# Patient Record
Sex: Female | Born: 1945 | Race: White | Hispanic: No | Smoking: Never smoker
Health system: Southern US, Community
[De-identification: ages and names within clinical notes are randomized; demographics above are authoritative.]

## PROBLEM LIST (undated history)

## (undated) DIAGNOSIS — I10 Essential (primary) hypertension: Secondary | ICD-10-CM

## (undated) DIAGNOSIS — M810 Age-related osteoporosis without current pathological fracture: Secondary | ICD-10-CM

## (undated) DIAGNOSIS — M7020 Olecranon bursitis, unspecified elbow: Secondary | ICD-10-CM

## (undated) DIAGNOSIS — E785 Hyperlipidemia, unspecified: Secondary | ICD-10-CM

## (undated) HISTORY — PX: ABDOMINAL HYSTERECTOMY: SHX81

## (undated) HISTORY — DX: Hyperlipidemia, unspecified: E78.5

## (undated) HISTORY — DX: Olecranon bursitis, unspecified elbow: M70.20

## (undated) HISTORY — DX: Age-related osteoporosis without current pathological fracture: M81.0

---

## 2006-10-22 ENCOUNTER — Emergency Department: Payer: Self-pay | Admitting: Emergency Medicine

## 2006-10-22 ENCOUNTER — Other Ambulatory Visit: Payer: Self-pay

## 2006-10-26 ENCOUNTER — Ambulatory Visit: Payer: Self-pay | Admitting: Emergency Medicine

## 2007-04-17 ENCOUNTER — Emergency Department: Payer: Self-pay | Admitting: Emergency Medicine

## 2007-04-20 ENCOUNTER — Other Ambulatory Visit: Payer: Self-pay

## 2007-04-20 ENCOUNTER — Ambulatory Visit: Payer: Self-pay | Admitting: Unknown Physician Specialty

## 2007-10-08 ENCOUNTER — Other Ambulatory Visit: Payer: Self-pay

## 2007-10-08 ENCOUNTER — Emergency Department: Payer: Self-pay | Admitting: Emergency Medicine

## 2009-01-16 ENCOUNTER — Ambulatory Visit: Payer: Self-pay

## 2009-03-11 ENCOUNTER — Ambulatory Visit: Payer: Self-pay | Admitting: Family Medicine

## 2010-02-28 DIAGNOSIS — L570 Actinic keratosis: Secondary | ICD-10-CM

## 2010-02-28 HISTORY — DX: Actinic keratosis: L57.0

## 2010-07-24 ENCOUNTER — Ambulatory Visit: Payer: Self-pay

## 2012-11-15 ENCOUNTER — Ambulatory Visit: Payer: Self-pay | Admitting: Family Medicine

## 2014-05-16 ENCOUNTER — Ambulatory Visit: Payer: Self-pay | Admitting: Neurology

## 2014-05-17 ENCOUNTER — Ambulatory Visit: Payer: Self-pay | Admitting: Family Medicine

## 2014-09-16 ENCOUNTER — Other Ambulatory Visit: Payer: Self-pay | Admitting: Family Medicine

## 2014-11-26 ENCOUNTER — Other Ambulatory Visit: Payer: Self-pay | Admitting: Family Medicine

## 2015-02-04 ENCOUNTER — Emergency Department: Payer: Worker's Compensation

## 2015-02-04 ENCOUNTER — Encounter: Payer: Self-pay | Admitting: Emergency Medicine

## 2015-02-04 ENCOUNTER — Inpatient Hospital Stay
Admission: EM | Admit: 2015-02-04 | Discharge: 2015-02-08 | DRG: 482 | Disposition: A | Discharge status: Skilled Nursing Facility | Payer: Worker's Compensation | Attending: Internal Medicine | Admitting: Internal Medicine

## 2015-02-04 DIAGNOSIS — W010XXA Fall on same level from slipping, tripping and stumbling without subsequent striking against object, initial encounter: Secondary | ICD-10-CM | POA: Diagnosis present

## 2015-02-04 DIAGNOSIS — I517 Cardiomegaly: Secondary | ICD-10-CM | POA: Diagnosis present

## 2015-02-04 DIAGNOSIS — M810 Age-related osteoporosis without current pathological fracture: Secondary | ICD-10-CM | POA: Diagnosis present

## 2015-02-04 DIAGNOSIS — E785 Hyperlipidemia, unspecified: Secondary | ICD-10-CM | POA: Diagnosis present

## 2015-02-04 DIAGNOSIS — I10 Essential (primary) hypertension: Secondary | ICD-10-CM | POA: Diagnosis present

## 2015-02-04 DIAGNOSIS — Z79899 Other long term (current) drug therapy: Secondary | ICD-10-CM | POA: Diagnosis not present

## 2015-02-04 DIAGNOSIS — Z419 Encounter for procedure for purposes other than remedying health state, unspecified: Secondary | ICD-10-CM

## 2015-02-04 DIAGNOSIS — S72141A Displaced intertrochanteric fracture of right femur, initial encounter for closed fracture: Principal | ICD-10-CM | POA: Diagnosis present

## 2015-02-04 HISTORY — DX: Essential (primary) hypertension: I10

## 2015-02-04 LAB — CBC WITH DIFFERENTIAL/PLATELET
Basophils Absolute: 0 10*3/uL (ref 0–0.1)
Basophils Relative: 0 %
Eosinophils Absolute: 0.1 10*3/uL (ref 0–0.7)
Eosinophils Relative: 1 %
HCT: 42.1 % (ref 35.0–47.0)
HEMOGLOBIN: 14 g/dL (ref 12.0–16.0)
LYMPHS ABS: 4.6 10*3/uL — AB (ref 1.0–3.6)
LYMPHS PCT: 41 %
MCH: 30.5 pg (ref 26.0–34.0)
MCHC: 33.1 g/dL (ref 32.0–36.0)
MCV: 91.9 fL (ref 80.0–100.0)
Monocytes Absolute: 0.3 10*3/uL (ref 0.2–0.9)
Monocytes Relative: 3 %
NEUTROS PCT: 55 %
Neutro Abs: 6.4 10*3/uL (ref 1.4–6.5)
Platelets: 160 10*3/uL (ref 150–440)
RBC: 4.58 MIL/uL (ref 3.80–5.20)
RDW: 13.4 % (ref 11.5–14.5)
WBC: 11.4 10*3/uL — AB (ref 3.6–11.0)

## 2015-02-04 LAB — BASIC METABOLIC PANEL
Anion gap: 8 (ref 5–15)
BUN: 23 mg/dL — AB (ref 6–20)
CHLORIDE: 108 mmol/L (ref 101–111)
CO2: 26 mmol/L (ref 22–32)
Calcium: 9.9 mg/dL (ref 8.9–10.3)
Creatinine, Ser: 0.74 mg/dL (ref 0.44–1.00)
GFR calc Af Amer: >60 mL/min (ref >60)
GFR calc non Af Amer: >60 mL/min (ref >60)
GLUCOSE: 99 mg/dL (ref 65–99)
POTASSIUM: 3.9 mmol/L (ref 3.5–5.1)
Sodium: 142 mmol/L (ref 135–145)

## 2015-02-04 LAB — APTT: aPTT: <24 seconds — ABNORMAL LOW (ref 24–36)

## 2015-02-04 LAB — PROTIME-INR
INR: 1.02
PROTHROMBIN TIME: 13.6 s (ref 11.4–15.0)

## 2015-02-04 LAB — SURGICAL PCR SCREEN
MRSA, PCR: NEGATIVE
Staphylococcus aureus: NEGATIVE

## 2015-02-04 MED ORDER — ONDANSETRON HCL 4 MG/2ML IJ SOLN
4.0000 mg | Freq: Once | INTRAMUSCULAR | Status: AC
  Administered 2015-02-04: 4 mg via INTRAVENOUS
  Filled 2015-02-04: qty 2

## 2015-02-04 MED ORDER — RALOXIFENE HCL 60 MG PO TABS
60.0000 mg | ORAL_TABLET | Freq: Every day | ORAL | Status: DC
  Administered 2015-02-06 – 2015-02-08 (×3): 60 mg via ORAL
  Filled 2015-02-04 (×3): qty 1

## 2015-02-04 MED ORDER — ACETAMINOPHEN 325 MG PO TABS: 650.0000 mg | ORAL_TABLET | Freq: Four times a day (QID) | ORAL | Status: DC | PRN

## 2015-02-04 MED ORDER — TURMERIC CURCUMIN 500 MG PO CAPS: 500.0000 mg | ORAL_CAPSULE | Freq: Every day | ORAL | Status: DC

## 2015-02-04 MED ORDER — DOCUSATE SODIUM 100 MG PO CAPS
100.0000 mg | ORAL_CAPSULE | Freq: Two times a day (BID) | ORAL | Status: DC
  Filled 2015-02-04: qty 1

## 2015-02-04 MED ORDER — METOPROLOL TARTRATE 50 MG PO TABS
50.0000 mg | ORAL_TABLET | Freq: Every morning | ORAL | Status: DC
  Administered 2015-02-05 – 2015-02-08 (×4): 50 mg via ORAL
  Filled 2015-02-04 (×4): qty 1

## 2015-02-04 MED ORDER — CALCIUM CARBONATE ANTACID 500 MG PO CHEW
1.0000 | CHEWABLE_TABLET | Freq: Every day | ORAL | Status: DC
  Administered 2015-02-06 – 2015-02-08 (×3): 200 mg via ORAL
  Filled 2015-02-04 (×4): qty 1

## 2015-02-04 MED ORDER — ONDANSETRON HCL 4 MG/2ML IJ SOLN
4.0000 mg | Freq: Four times a day (QID) | INTRAMUSCULAR | Status: DC | PRN
  Administered 2015-02-05: 4 mg via INTRAVENOUS

## 2015-02-04 MED ORDER — PRAVASTATIN SODIUM 20 MG PO TABS
20.0000 mg | ORAL_TABLET | Freq: Every day | ORAL | Status: DC
  Administered 2015-02-06 – 2015-02-08 (×3): 20 mg via ORAL
  Filled 2015-02-04 (×3): qty 1

## 2015-02-04 MED ORDER — CEFAZOLIN SODIUM-DEXTROSE 2-3 GM-% IV SOLR
2.0000 g | INTRAVENOUS | Status: AC
  Administered 2015-02-05: 2 g via INTRAVENOUS
  Filled 2015-02-04: qty 50

## 2015-02-04 MED ORDER — VITAMIN C 500 MG PO TABS
500.0000 mg | ORAL_TABLET | Freq: Every day | ORAL | Status: DC
  Administered 2015-02-06 – 2015-02-08 (×3): 500 mg via ORAL
  Filled 2015-02-04 (×3): qty 1

## 2015-02-04 MED ORDER — VITAMIN D 1000 UNITS PO TABS
1000.0000 [IU] | ORAL_TABLET | Freq: Every day | ORAL | Status: DC
  Administered 2015-02-06 – 2015-02-08 (×3): 1000 [IU] via ORAL
  Filled 2015-02-04 (×3): qty 1

## 2015-02-04 MED ORDER — SODIUM CHLORIDE 0.9 % IV BOLUS (SEPSIS)
500.0000 mL | Freq: Once | INTRAVENOUS | Status: AC
  Administered 2015-02-04: 500 mL via INTRAVENOUS

## 2015-02-04 MED ORDER — MORPHINE SULFATE (PF) 4 MG/ML IV SOLN: 4.0000 mg | Freq: Once | INTRAVENOUS | Status: DC

## 2015-02-04 MED ORDER — CALCIUM-VITAMIN D-VITAMIN K 500-1000-40 MG-UNT-MCG PO CHEW: CHEWABLE_TABLET | Freq: Every day | ORAL | Status: DC

## 2015-02-04 MED ORDER — ADULT MULTIVITAMIN W/MINERALS CH
1.0000 | ORAL_TABLET | Freq: Every day | ORAL | Status: DC
  Administered 2015-02-06 – 2015-02-08 (×3): 1 via ORAL
  Filled 2015-02-04 (×3): qty 1

## 2015-02-04 MED ORDER — ACETAMINOPHEN 650 MG RE SUPP: 650.0000 mg | Freq: Four times a day (QID) | RECTAL | Status: DC | PRN

## 2015-02-04 MED ORDER — ONDANSETRON HCL 4 MG PO TABS: 4.0000 mg | ORAL_TABLET | Freq: Four times a day (QID) | ORAL | Status: DC | PRN

## 2015-02-04 MED ORDER — ONDANSETRON HCL 4 MG/2ML IJ SOLN: 4.0000 mg | Freq: Once | INTRAMUSCULAR | Status: DC

## 2015-02-04 MED ORDER — HYDROCODONE-ACETAMINOPHEN 5-325 MG PO TABS: 1.0000 | ORAL_TABLET | ORAL | Status: DC | PRN

## 2015-02-04 MED ORDER — SENNOSIDES-DOCUSATE SODIUM 8.6-50 MG PO TABS: 1.0000 | ORAL_TABLET | Freq: Every evening | ORAL | Status: DC | PRN

## 2015-02-04 MED ORDER — RED YEAST RICE 600 MG PO CAPS: 600.0000 mg | ORAL_CAPSULE | Freq: Every day | ORAL | Status: DC

## 2015-02-04 MED ORDER — MORPHINE SULFATE (PF) 4 MG/ML IV SOLN
4.0000 mg | Freq: Once | INTRAVENOUS | Status: AC
  Administered 2015-02-04: 4 mg via INTRAVENOUS
  Filled 2015-02-04: qty 1

## 2015-02-04 MED ORDER — MORPHINE SULFATE (PF) 2 MG/ML IV SOLN
2.0000 mg | INTRAVENOUS | Status: DC | PRN
  Administered 2015-02-04 – 2015-02-05 (×3): 2 mg via INTRAVENOUS
  Filled 2015-02-04 (×3): qty 1

## 2015-02-04 NOTE — ED Notes (Signed)
Right hip swollen, painful. + pulses. No other injury noted. Pt with no other complaints.

## 2015-02-04 NOTE — Consult Note (Signed)
ORTHOPAEDIC CONSULTATION  REQUESTING PHYSICIAN: Nicholes Mango, MD  Chief Complaint:   Right hip pain.  History of Present Illness: Stacey [name] is a 69 y.o. female with a history of hypertension and osteoporosis, but otherwise in good health. Apparently, she was at her place of work (She works part-time at [employer] in [place].) when she turned to go back to [employer] while helping a shopper and lost her balance, falling onto her right side. She was unable to get up and so was brought by the paramedics to the hospital where x-rays in the emergency room demonstrated a displaced intertrochanteric fracture of her right hip. She is admitted at this time for definitive management of her injury. The patient denies any loss of consciousness or other injury associated with her fall. She also denies any lightheadedness, dizziness, chest pain, or other symptoms that may have precipitated her fall. She denies any numbness or paresthesias to her right lower extremity.  Past Medical History  Diagnosis Date  . Hypertension    Past Surgical History  Procedure Laterality Date  . Abdominal hysterectomy     Social History   Social History  . Marital Status: Married    Spouse Name: N/A  . Number of Children: N/A  . Years of Education: N/A   Social History Main Topics  . Smoking status: Never Smoker   . Smokeless tobacco: None  . Alcohol Use: No  . Drug Use: None  . Sexual Activity: Not Asked   Other Topics Concern  . None   Social History Narrative  . None   History reviewed. No pertinent family history. Allergies no known allergies Prior to Admission medications   Medication Sig Start Date End Date Taking? Authorizing Provider  Calcium Citrate-Vitamin D (CALCIUM + D PO) Take 1 tablet by mouth daily.   Yes Historical Provider, MD  Cholecalciferol (VITAMIN D-3) 1000 UNITS CAPS Take 1 capsule by mouth daily.   Yes  Historical Provider, MD  GARLIC PO Take 9,381 mg by mouth daily.    Yes Historical Provider, MD  metoprolol (LOPRESSOR) 50 MG tablet Take 50 mg by mouth every morning.   Yes Historical Provider, MD  Multiple Vitamin (MULTI-VITAMINS) TABS Take 1 tablet by mouth daily.   Yes Historical Provider, MD  pravastatin (PRAVACHOL) 20 MG tablet TAKE 1 TABLET BY MOUTH DAILY 11/27/14  Yes Vickki Muff Chrismon, PA  raloxifene (EVISTA) 60 MG tablet TAKE 1 TABLET BY MOUTH DAILY 11/27/14  Yes Dennis E Chrismon, PA  Red Yeast Rice Extract (RED YEAST RICE PO) Take 600 mg by mouth daily.    Yes Historical Provider, MD  Turmeric Curcumin 500 MG CAPS Take 500 mg by mouth daily.   Yes Historical Provider, MD  vitamin C (ASCORBIC ACID) 500 MG tablet Take 1 tablet by mouth daily.   Yes Historical Provider, MD   Dg Chest Portable 1 View  02/04/2015  CLINICAL DATA:  69 year old female preoperative study. Hypertension. Initial encounter. EXAM: PORTABLE CHEST 1 VIEW COMPARISON:  None. FINDINGS: Portable AP supine view at 1502 hours. Cardiomegaly. Mildly tortuous thoracic aorta. Other mediastinal contours are within normal limits. Visualized tracheal air column is within normal limits. Allowing for portable technique, the lungs are clear. No pneumothorax or pleural effusion evident on this supine view. IMPRESSION: Cardiomegaly.  No acute cardiopulmonary abnormality. Electronically Signed   By: Genevie Ann M.D.   On: 02/04/2015 15:16   Dg Hip Unilat With Pelvis 2-3 Views Right  02/04/2015  CLINICAL DATA:  Pain following fall EXAM: DG HIP (WITH OR WITHOUT PELVIS) 2-3V RIGHT COMPARISON:  None. FINDINGS: Frontal pelvis as well as frontal and lateral right hip images were obtained. There is a comminuted intertrochanteric femur fracture on the right with varus angulation at the fracture site. There is no other demonstrable fracture. No dislocation. There is mild narrowing of both hip joints. Bones are diffusely osteoporotic. There are foci of  arterial vascular calcification bilaterally. IMPRESSION: Comminuted intertrochanteric femur fracture on the right with varus angulation at the fracture site. No other fractures. No dislocation. Bones are diffusely osteoporotic. Electronically Signed   By: Lowella Grip III M.D.   On: 02/04/2015 14:58    Positive ROS: All other systems have been reviewed and were otherwise negative with the exception of those mentioned in the HPI and as above.  Physical Exam: General:  Alert, no acute distress Psychiatric:  Patient is competent for consent with normal mood and affect   Cardiovascular:  No pedal edema Respiratory:  No wheezing, non-labored breathing GI:  Abdomen is soft and non-tender Skin:  No lesions in the area of chief complaint Neurologic:  Sensation intact distally Lymphatic:  No axillary or cervical lymphadenopathy  Orthopedic Exam:  Orthopedic examination is limited to the right hip and lower extremity. The right lower extending somewhat shortened and extend rotated as compared to the left. She has pain with any attempted active or passive motion of the right hip and lower extremity. Skin inspection around her hip is unremarkable. She has mild tenderness to palpation over the lateral aspect of the hip. She is neurovascularly intact to the right lower extremity and she can actively dorsiflex and plantarflex her ankle and foot. She has intact sensation to light touch to all distributions. She has good capillary refill to all digits of her right foot.  X-rays:  X-rays of the pelvis and right hip are available for review. These films demonstrate a displaced three-part intertrochanteric fracture of the right hip. No significant degenerative changes are noted. No other acute abnormalities are identified. She does have moderate osteopenia diffusely.  Assessment: Displaced three-part intertrochanteric fracture right hip.  Plan: The treatment options are discussed with the patient and her  family at the bedside. The recommended treatment is a trochanteric femoral nailing of the right femur. The procedure has been discussed in detail with the patient has had the potential risks (including bleeding, infection, nerve and/or blood vessel injury, persistent or recurrent pain, hip and/or knee stiffness, malunion and/or nonunion, leg length inequality, need for further surgery, blood clots, strokes, heart attacks and/or arrhythmias, etc.) and benefits. The patient states her understanding and wishes to proceed. A formal written consent will be obtained by the nursing staff.  Thank you for ask me to participate in the care of this most delightful woman. I will be happy to follow her with you.   Pascal Lux, MD  Beeper #:  [phone]  02/04/2015 6:05 PM

## 2015-02-04 NOTE — ED Provider Notes (Signed)
[facility] Emergency Department Provider Note  ____________________________________________  Time seen: Approximately 1:34 PM  I have reviewed the triage vital signs and the nursing notes.   HISTORY  Chief Complaint Hip Pain    HPI Stacey [name] is a 69 y.o. female with history of hypertension and hyperlipidemia who presents for evaluation of traumatic right hip pain which began suddenly just prior to arrival, has been constant since onset and is worse with movement. The patient reports that she was at work. She works in [employer], turned around suddenly to speak to a customer, tripped over her foot and fell, landing onto her right hip. No head injury or loss of consciousness. Currently her pain is moderate to severe.   Past Medical History  Diagnosis Date  . Hypertension     Patient Active Problem List   Diagnosis Date Noted  . Intertrochanteric fracture of right femur ([place]) 02/04/2015    Past Surgical History  Procedure Laterality Date  . Abdominal hysterectomy      Current Outpatient Rx  Name  Route  Sig  Dispense  Refill  . Calcium Citrate-Vitamin D (CALCIUM + D PO)   Oral   Take 1 tablet by mouth daily.         . Cholecalciferol (VITAMIN D-3) 1000 UNITS CAPS   Oral   Take 1 capsule by mouth daily.         Marland Kitchen GARLIC PO   Oral   Take 1,000 mg by mouth daily.          . metoprolol (LOPRESSOR) 50 MG tablet   Oral   Take 50 mg by mouth every morning.         . Multiple Vitamin (MULTI-VITAMINS) TABS   Oral   Take 1 tablet by mouth daily.         . pravastatin (PRAVACHOL) 20 MG tablet      TAKE 1 TABLET BY MOUTH DAILY   30 tablet   1     CYCLE FILL MEDICATION. Authorization is required f ...   . raloxifene (EVISTA) 60 MG tablet      TAKE 1 TABLET BY MOUTH DAILY   30 tablet   1     CYCLE FILL MEDICATION. Authorization is required f ...   . Red Yeast Rice Extract (RED YEAST RICE PO)   Oral   Take 600 mg  by mouth daily.          . Turmeric Curcumin 500 MG CAPS   Oral   Take 500 mg by mouth daily.         . vitamin C (ASCORBIC ACID) 500 MG tablet   Oral   Take 1 tablet by mouth daily.           Allergies Review of patient's allergies indicates no known allergies.  History reviewed. No pertinent family history.  Social History Social History  Substance Use Topics  . Smoking status: Never Smoker   . Smokeless tobacco: None  . Alcohol Use: No    Review of Systems Constitutional: No fever/chills Eyes: No visual changes. ENT: No sore throat. Cardiovascular: Denies chest pain. Respiratory: Denies shortness of breath. Gastrointestinal: No abdominal pain.  No nausea, no vomiting.  No diarrhea.  No constipation. Genitourinary: Negative for dysuria. Musculoskeletal: Negative for back pain. Skin: Negative for rash. Neurological: Negative for headaches, focal weakness or numbness.  10-point ROS otherwise negative.  ____________________________________________   PHYSICAL EXAM:  Filed Vitals:   02/04/15  1336 02/04/15 1407 02/04/15 1502 02/04/15 1600  BP: 143/96 136/89  147/98  Pulse:  68 66 70  Temp:      Height:      Weight:      SpO2:  98% 96% 97%    Constitutional: Alert and oriented. Well appearing and in no acute distress. Eyes: Conjunctivae are normal. PERRL. EOMI. Head: Atraumatic. Nose: No congestion/rhinnorhea. Mouth/Throat: Mucous membranes are moist.  Oropharynx non-erythematous. Neck: No stridor.  No cervical spine tenderness to palpation. Cardiovascular: Normal rate, regular rhythm. Grossly normal heart sounds.  Good peripheral circulation. Respiratory: Normal respiratory effort.  No retractions. Lungs CTAB. Gastrointestinal: Soft and nontender. No distention.  No CVA tenderness. Genitourinary: deferred Musculoskeletal: Tenderness to palpation throughout the right groin. 2+ right DP pulse. The right leg is slightly shortened and externally rotated.  No tenderness throughout the left hemipelvis.  Neurologic:  Normal speech and language. No gross focal neurologic deficits are appreciated.  Skin:  Skin is warm, dry and intact. No rash noted. Psychiatric: Mood and affect are normal. Speech and behavior are normal.  ____________________________________________   LABS (all labs ordered are listed, but only abnormal results are displayed)  Labs Reviewed  CBC WITH DIFFERENTIAL/PLATELET - Abnormal; Notable for the following:    WBC 11.4 (*)    Lymphs Abs 4.6 (*)    All other components within normal limits  BASIC METABOLIC PANEL - Abnormal; Notable for the following:    BUN 23 (*)    All other components within normal limits  PROTIME-INR  APTT   ____________________________________________  EKG  ED ECG REPORT I, Joanne Gavel, the attending physician, personally viewed and interpreted this ECG.   Date: 02/04/2015  EKG Time: 15:59  Rate: 70  Rhythm: normal sinus rhythm  Axis: left  Intervals:none  ST&T Change: No acute ST elevation  ____________________________________________  RADIOLOGY  CXR IMPRESSION: Cardiomegaly. No acute cardiopulmonary abnormality.  Xray right hip IMPRESSION: Comminuted intertrochanteric femur fracture on the right with varus angulation at the fracture site. No other fractures. No dislocation. Bones are diffusely osteoporotic.  ____________________________________________   PROCEDURES  Procedure(s) performed: None  Critical Care performed: No  ____________________________________________   INITIAL IMPRESSION / ASSESSMENT AND PLAN / ED COURSE  Pertinent labs & imaging results that were available during my care of the patient were reviewed by me and considered in my medical decision making (see chart for details).  Stacey [name] is a 69 y.o. female with history of hypertension and hyperlipidemia who presents for evaluation of traumatic right hip pain which began suddenly just  prior to arrival. Plain films consistent with right intertrochanteric femur fracture and she is neurovascularly intact distally. I reviewed notable for mild leukocytosis, likely catecholamine induced. BMP unremarkable. Awaiting coags. Case discussed with Dr. Roland Rack of orthopedic surgery who is aware of the patient. Case discussed with Dr. Margaretmary Eddy, hospitalist, for admission at 4:10 PM. ____________________________________________   FINAL CLINICAL IMPRESSION(S) / ED DIAGNOSES  Final diagnoses:  Closed displaced intertrochanteric fracture of right femur, initial encounter ([place])      Joanne Gavel, MD 02/04/15 1611

## 2015-02-04 NOTE — Progress Notes (Signed)
PHARMACIST - PHYSICIAN ORDER COMMUNICATION  CONCERNING: P&T Medication Policy on Herbal Medications  DESCRIPTION:  This patient's order for:  Red rice yeast and tumeric  has been noted.  This product(s) is classified as an "herbal" or natural product. Due to a lack of definitive safety studies or FDA approval, nonstandard manufacturing practices, plus the potential risk of unknown drug-drug interactions while on inpatient medications, the Pharmacy and Therapeutics Committee does not permit the use of "herbal" or natural products of this type within [facility].   ACTION TAKEN: The pharmacy department is unable to verify this order at this time and your patient has been informed of this safety policy. Please reevaluate patient's clinical condition at discharge and address if the herbal or natural product(s) should be resumed at that time.

## 2015-02-04 NOTE — H&P (Signed)
[facility] [place] NAME: Stacey [name]    MR#:  [id number]  DATE OF BIRTH:  16-Jun-1945  DATE OF ADMISSION:  02/04/2015  PRIMARY CARE PHYSICIAN: No primary care provider on file.   REQUESTING/REFERRING PHYSICIAN: Edd Fabian ,MD  CHIEF COMPLAINT:   fall HISTORY OF PRESENT ILLNESS:  Stacey [name]  is a 69 y.o. female with a known history of hypertension, hyperlipidemia and osteoporosis is brought into the ED after she sustained a fall. Patient tripped over and landed on her right side and came into the ED with right hip pain. Xray has revealed rt intertrochanteric fracture. ED physician has discussed with on-call orthopedic doctor, who is going to see the patient PAST MEDICAL HISTORY:   Past Medical History  Diagnosis Date  . Hypertension   Hyperlipidemia, osteoporosis    PAST SURGICAL HISTOIRY:   Past Surgical History  Procedure Laterality Date  . Abdominal hysterectomy      SOCIAL HISTORY:   Social History  Substance Use Topics  . Smoking status: Never Smoker   . Smokeless tobacco: Not on file  . Alcohol Use: No    FAMILY HISTORY:  HTN runs in her family  DRUG ALLERGIES:  Allergies no known allergies  REVIEW OF SYSTEMS:  CONSTITUTIONAL: No fever, fatigue or weakness.  EYES: No blurred or double vision.  EARS, NOSE, AND THROAT: No tinnitus or ear pain.  RESPIRATORY: No cough, shortness of breath, wheezing or hemoptysis.  CARDIOVASCULAR: No chest pain, orthopnea, edema.  GASTROINTESTINAL: No nausea, vomiting, diarrhea or abdominal pain.  GENITOURINARY: No dysuria, hematuria.  ENDOCRINE: No polyuria, nocturia,  HEMATOLOGY: No anemia, easy bruising or bleeding SKIN: No rash or lesion. MUSCULOSKELETAL: Rt hip pain. Has osteoporosis . No arthritis.   NEUROLOGIC: No tingling, numbness, weakness.  PSYCHIATRY: No anxiety or depression.   MEDICATIONS AT HOME:   Prior to Admission medications   Medication Sig  Start Date End Date Taking? Authorizing Provider  Calcium Citrate-Vitamin D (CALCIUM + D PO) Take 1 tablet by mouth daily.   Yes Historical Provider, MD  Cholecalciferol (VITAMIN D-3) 1000 UNITS CAPS Take 1 capsule by mouth daily.   Yes Historical Provider, MD  GARLIC PO Take 6,948 mg by mouth daily.    Yes Historical Provider, MD  metoprolol (LOPRESSOR) 50 MG tablet Take 50 mg by mouth every morning.   Yes Historical Provider, MD  Multiple Vitamin (MULTI-VITAMINS) TABS Take 1 tablet by mouth daily.   Yes Historical Provider, MD  pravastatin (PRAVACHOL) 20 MG tablet TAKE 1 TABLET BY MOUTH DAILY 11/27/14  Yes Vickki Muff Chrismon, PA  raloxifene (EVISTA) 60 MG tablet TAKE 1 TABLET BY MOUTH DAILY 11/27/14  Yes Dennis E Chrismon, PA  Red Yeast Rice Extract (RED YEAST RICE PO) Take 600 mg by mouth daily.    Yes Historical Provider, MD  Turmeric Curcumin 500 MG CAPS Take 500 mg by mouth daily.   Yes Historical Provider, MD  vitamin C (ASCORBIC ACID) 500 MG tablet Take 1 tablet by mouth daily.   Yes Historical Provider, MD      VITAL SIGNS:  Blood pressure 147/98, pulse 70, temperature 97.4 F (36.3 C), height 5\' 5"  (1.651 m), weight 63.05 kg (139 lb), SpO2 97 %.  PHYSICAL EXAMINATION:  GENERAL:  69 y.o.-year-old patient lying in the bed with no acute distress.  EYES: Pupils equal, round, reactive to light and accommodation. No scleral icterus. Extraocular muscles intact.  HEENT: Head atraumatic, normocephalic. Oropharynx and nasopharynx clear.  NECK:  Supple, no jugular venous distention. No thyroid enlargement, no tenderness.  LUNGS: Normal breath sounds bilaterally, no wheezing, rales,rhonchi or crepitation. No use of accessory muscles of respiration.  CARDIOVASCULAR: S1, S2 normal. No murmurs, rubs, or gallops.  ABDOMEN: Soft, nontender, nondistended. Bowel sounds present. No organomegaly or mass.  EXTREMITIES: Right hip is tender, abducted. No pedal edema, cyanosis, or clubbing.  NEUROLOGIC:  Cranial nerves II through XII are intact. Muscle strength 5/5 in all extremities. Sensation intact. Gait not checked.  PSYCHIATRIC: The patient is alert and oriented x 3.  SKIN: No obvious rash, lesion, or ulcer.   LABORATORY PANEL:   CBC  Recent Labs Lab 02/04/15 1402  WBC 11.4*  HGB 14.0  HCT 42.1  PLT 160   ------------------------------------------------------------------------------------------------------------------  Chemistries   Recent Labs Lab 02/04/15 1402  NA 142  K 3.9  CL 108  CO2 26  GLUCOSE 99  BUN 23*  CREATININE 0.74  CALCIUM 9.9   ------------------------------------------------------------------------------------------------------------------  Cardiac Enzymes No results for input(s): TROPONINI in the last 168 hours. ------------------------------------------------------------------------------------------------------------------  RADIOLOGY:  Dg Chest Portable 1 View  02/04/2015  CLINICAL DATA:  69 year old female preoperative study. Hypertension. Initial encounter. EXAM: PORTABLE CHEST 1 VIEW COMPARISON:  None. FINDINGS: Portable AP supine view at 1502 hours. Cardiomegaly. Mildly tortuous thoracic aorta. Other mediastinal contours are within normal limits. Visualized tracheal air column is within normal limits. Allowing for portable technique, the lungs are clear. No pneumothorax or pleural effusion evident on this supine view. IMPRESSION: Cardiomegaly.  No acute cardiopulmonary abnormality. Electronically Signed   By: Genevie Ann M.D.   On: 02/04/2015 15:16   Dg Hip Unilat With Pelvis 2-3 Views Right  02/04/2015  CLINICAL DATA:  Pain following fall EXAM: DG HIP (WITH OR WITHOUT PELVIS) 2-3V RIGHT COMPARISON:  None. FINDINGS: Frontal pelvis as well as frontal and lateral right hip images were obtained. There is a comminuted intertrochanteric femur fracture on the right with varus angulation at the fracture site. There is no other demonstrable fracture. No  dislocation. There is mild narrowing of both hip joints. Bones are diffusely osteoporotic. There are foci of arterial vascular calcification bilaterally. IMPRESSION: Comminuted intertrochanteric femur fracture on the right with varus angulation at the fracture site. No other fractures. No dislocation. Bones are diffusely osteoporotic. Electronically Signed   By: Lowella Grip III M.D.   On: 02/04/2015 14:58    EKG:   Orders placed or performed during the hospital encounter of 02/04/15  . EKG 12-Lead  . EKG 12-Lead    IMPRESSION AND PLAN:  Patient is brought into the ED after she sustained a fall by tripping over  1. Right femoral intertrochanteric fracture Consult ortrho -  Pain management PRN Medically cleared for surgery   2. Essential hypertension We'll continue metoprolol  3. Hyperlipidemia Continue home medication statin  4. Chronic history of osteoporosis Continue home med Evista  DVT prophylaxis with teds  All the records are reviewed and case discussed with ED provider. Management plans discussed with the patient, family and they are in agreement.  CODE STATUS: FC,daughter is the HCPOA  TOTAL TIME TAKING CARE OF THIS PATIENT: 45 minutes.    Nicholes Mango M.D on 02/04/2015 at 4:09 PM  Between 7am to 6pm - Pager - [phone]  After 6pm go to [email or web address] - password [email or web address] [facility]  [facility]  Office  [phone]  CC: Primary care physician; No primary care provider on file.

## 2015-02-04 NOTE — ED Notes (Signed)
Pt at work and tripped and fell. C/o right hip pain

## 2015-02-05 ENCOUNTER — Inpatient Hospital Stay: Payer: Worker's Compensation | Admitting: Certified Registered Nurse Anesthetist

## 2015-02-05 ENCOUNTER — Encounter
Admission: EM | Disposition: A | Discharge status: Skilled Nursing Facility | Payer: Self-pay | Source: Home / Self Care | Attending: Internal Medicine

## 2015-02-05 ENCOUNTER — Inpatient Hospital Stay: Payer: Worker's Compensation

## 2015-02-05 HISTORY — PX: INTRAMEDULLARY (IM) NAIL INTERTROCHANTERIC: SHX5875

## 2015-02-05 LAB — BASIC METABOLIC PANEL
Anion gap: 6 (ref 5–15)
BUN: 20 mg/dL (ref 6–20)
CHLORIDE: 105 mmol/L (ref 101–111)
CO2: 23 mmol/L (ref 22–32)
CREATININE: 0.79 mg/dL (ref 0.44–1.00)
Calcium: 8.5 mg/dL — ABNORMAL LOW (ref 8.9–10.3)
GFR calc Af Amer: >60 mL/min (ref >60)
GFR calc non Af Amer: >60 mL/min (ref >60)
GLUCOSE: 106 mg/dL — AB (ref 65–99)
Potassium: 3.8 mmol/L (ref 3.5–5.1)
SODIUM: 134 mmol/L — AB (ref 135–145)

## 2015-02-05 LAB — CBC
HEMATOCRIT: 36.5 % (ref 35.0–47.0)
HEMOGLOBIN: 12 g/dL (ref 12.0–16.0)
MCH: 30.1 pg (ref 26.0–34.0)
MCHC: 33 g/dL (ref 32.0–36.0)
MCV: 91.3 fL (ref 80.0–100.0)
Platelets: 153 10*3/uL (ref 150–440)
RBC: 4 MIL/uL (ref 3.80–5.20)
RDW: 12.9 % (ref 11.5–14.5)
WBC: 11.4 10*3/uL — ABNORMAL HIGH (ref 3.6–11.0)

## 2015-02-05 LAB — PROTIME-INR
INR: 1.15
Prothrombin Time: 14.9 seconds (ref 11.4–15.0)

## 2015-02-05 SURGERY — FIXATION, FRACTURE, INTERTROCHANTERIC, WITH INTRAMEDULLARY ROD
Anesthesia: Spinal | Site: Hip | Laterality: Right | Wound class: Clean

## 2015-02-05 MED ORDER — ONDANSETRON HCL 4 MG/2ML IJ SOLN: 4.0000 mg | Freq: Once | INTRAMUSCULAR | Status: DC | PRN

## 2015-02-05 MED ORDER — BISACODYL 10 MG RE SUPP
10.0000 mg | Freq: Every day | RECTAL | Status: DC | PRN
  Administered 2015-02-08: 10 mg via RECTAL
  Filled 2015-02-05: qty 1

## 2015-02-05 MED ORDER — FENTANYL CITRATE (PF) 100 MCG/2ML IJ SOLN
INTRAMUSCULAR | Status: DC | PRN
  Administered 2015-02-05: 50 ug via INTRAVENOUS

## 2015-02-05 MED ORDER — FENTANYL CITRATE (PF) 100 MCG/2ML IJ SOLN: 25.0000 ug | INTRAMUSCULAR | Status: DC | PRN

## 2015-02-05 MED ORDER — ACETAMINOPHEN 650 MG RE SUPP: 650.0000 mg | Freq: Four times a day (QID) | RECTAL | Status: DC | PRN

## 2015-02-05 MED ORDER — CEFAZOLIN SODIUM-DEXTROSE 2-3 GM-% IV SOLR
2.0000 g | Freq: Four times a day (QID) | INTRAVENOUS | Status: AC
  Administered 2015-02-05 – 2015-02-06 (×3): 2 g via INTRAVENOUS
  Filled 2015-02-05 (×3): qty 50

## 2015-02-05 MED ORDER — ONDANSETRON HCL 4 MG/2ML IJ SOLN: 4.0000 mg | Freq: Four times a day (QID) | INTRAMUSCULAR | Status: DC | PRN

## 2015-02-05 MED ORDER — DOCUSATE SODIUM 100 MG PO CAPS
100.0000 mg | ORAL_CAPSULE | Freq: Two times a day (BID) | ORAL | Status: DC
  Administered 2015-02-05 – 2015-02-08 (×6): 100 mg via ORAL
  Filled 2015-02-05 (×6): qty 1

## 2015-02-05 MED ORDER — MIDAZOLAM HCL 5 MG/5ML IJ SOLN
INTRAMUSCULAR | Status: DC | PRN
Start: 2015-02-05 — End: 2015-02-05
  Administered 2015-02-05: 2 mg via INTRAVENOUS

## 2015-02-05 MED ORDER — ONDANSETRON HCL 4 MG PO TABS: 4.0000 mg | ORAL_TABLET | Freq: Four times a day (QID) | ORAL | Status: DC | PRN

## 2015-02-05 MED ORDER — ENOXAPARIN SODIUM 40 MG/0.4ML ~~LOC~~ SOLN
40.0000 mg | SUBCUTANEOUS | Status: DC
  Administered 2015-02-06 – 2015-02-08 (×3): 40 mg via SUBCUTANEOUS
  Filled 2015-02-05 (×3): qty 0.4

## 2015-02-05 MED ORDER — DIPHENHYDRAMINE HCL 12.5 MG/5ML PO ELIX: 12.5000 mg | ORAL_SOLUTION | ORAL | Status: DC | PRN

## 2015-02-05 MED ORDER — METOCLOPRAMIDE HCL 5 MG PO TABS: 5.0000 mg | ORAL_TABLET | Freq: Three times a day (TID) | ORAL | Status: DC | PRN

## 2015-02-05 MED ORDER — HYDROMORPHONE HCL 1 MG/ML IJ SOLN
0.5000 mg | INTRAMUSCULAR | Status: DC | PRN
  Administered 2015-02-05 – 2015-02-06 (×2): 1 mg via INTRAVENOUS
  Filled 2015-02-05: qty 1

## 2015-02-05 MED ORDER — KCL IN DEXTROSE-NACL 20-5-0.9 MEQ/L-%-% IV SOLN
INTRAVENOUS | Status: DC
  Administered 2015-02-05 – 2015-02-06 (×2): via INTRAVENOUS
  Filled 2015-02-05 (×8): qty 1000

## 2015-02-05 MED ORDER — MAGNESIUM HYDROXIDE 400 MG/5ML PO SUSP
30.0000 mL | Freq: Every day | ORAL | Status: DC | PRN
  Administered 2015-02-08: 30 mL via ORAL
  Filled 2015-02-05: qty 30

## 2015-02-05 MED ORDER — PANTOPRAZOLE SODIUM 40 MG PO TBEC
40.0000 mg | DELAYED_RELEASE_TABLET | Freq: Two times a day (BID) | ORAL | Status: DC
  Administered 2015-02-05 – 2015-02-08 (×6): 40 mg via ORAL
  Filled 2015-02-05 (×6): qty 1

## 2015-02-05 MED ORDER — ACETAMINOPHEN 325 MG PO TABS: 650.0000 mg | ORAL_TABLET | Freq: Four times a day (QID) | ORAL | Status: DC | PRN

## 2015-02-05 MED ORDER — BUPIVACAINE HCL (PF) 0.5 % IJ SOLN
INTRAMUSCULAR | Status: DC | PRN
  Administered 2015-02-05: 3 mL

## 2015-02-05 MED ORDER — PROPOFOL 500 MG/50ML IV EMUL
INTRAVENOUS | Status: DC | PRN
  Administered 2015-02-05: 100 ug/kg/min via INTRAVENOUS

## 2015-02-05 MED ORDER — LACTATED RINGERS IV SOLN
INTRAVENOUS | Status: DC | PRN
  Administered 2015-02-05: 14:00:00 via INTRAVENOUS

## 2015-02-05 MED ORDER — PHENYLEPHRINE HCL 10 MG/ML IJ SOLN
INTRAMUSCULAR | Status: DC | PRN
  Administered 2015-02-05: 100 ug via INTRAVENOUS
  Administered 2015-02-05 (×2): 200 ug via INTRAVENOUS
  Administered 2015-02-05: 100 ug via INTRAVENOUS

## 2015-02-05 MED ORDER — FLEET ENEMA 7-19 GM/118ML RE ENEM: 1.0000 | ENEMA | Freq: Once | RECTAL | Status: DC | PRN

## 2015-02-05 MED ORDER — SODIUM CHLORIDE 0.9 % IV SOLN
INTRAVENOUS | Status: DC
  Administered 2015-02-05: 08:00:00 via INTRAVENOUS

## 2015-02-05 MED ORDER — METOCLOPRAMIDE HCL 5 MG/ML IJ SOLN: 5.0000 mg | Freq: Three times a day (TID) | INTRAMUSCULAR | Status: DC | PRN

## 2015-02-05 MED ORDER — NEOMYCIN-POLYMYXIN B GU 40-200000 IR SOLN
Status: DC | PRN
  Administered 2015-02-05: 2 mL

## 2015-02-05 MED ORDER — OXYCODONE HCL 5 MG PO TABS
5.0000 mg | ORAL_TABLET | ORAL | Status: DC | PRN
  Administered 2015-02-05: 5 mg via ORAL
  Administered 2015-02-05 – 2015-02-06 (×2): 10 mg via ORAL
  Administered 2015-02-06: 5 mg via ORAL
  Administered 2015-02-06: 10 mg via ORAL
  Administered 2015-02-07 (×2): 5 mg via ORAL
  Administered 2015-02-07 (×2): 10 mg via ORAL
  Administered 2015-02-08 (×2): 5 mg via ORAL
  Administered 2015-02-08: 10 mg via ORAL
  Filled 2015-02-05 (×2): qty 2
  Filled 2015-02-05: qty 1
  Filled 2015-02-05 (×4): qty 2
  Filled 2015-02-05: qty 1
  Filled 2015-02-05: qty 2
  Filled 2015-02-05 (×3): qty 1

## 2015-02-05 MED ORDER — PROPOFOL 10 MG/ML IV BOLUS
INTRAVENOUS | Status: DC | PRN
  Administered 2015-02-05 (×2): 10 mg via INTRAVENOUS

## 2015-02-05 MED ORDER — BUPIVACAINE-EPINEPHRINE 0.5% -1:200000 IJ SOLN
INTRAMUSCULAR | Status: DC | PRN
  Administered 2015-02-05: 30 mL

## 2015-02-05 SURGICAL SUPPLY — 37 items
BIT DRILL 4.3MMS DISTAL GRDTED (BIT) ×1 IMPLANT
BNDG COHESIVE 4X5 TAN STRL (GAUZE/BANDAGES/DRESSINGS) ×2 IMPLANT
BNDG COHESIVE 6X5 TAN STRL LF (GAUZE/BANDAGES/DRESSINGS) ×2 IMPLANT
CANISTER SUCT 1200ML W/VALVE (MISCELLANEOUS) ×2 IMPLANT
CHLORAPREP W/TINT 26ML (MISCELLANEOUS) ×4 IMPLANT
DRAPE C-ARMOR (DRAPES) ×2 IMPLANT
DRAPE SHEET LG 3/4 BI-LAMINATE (DRAPES) ×2 IMPLANT
DRAPE SURG 17X11 SM STRL (DRAPES) ×2 IMPLANT
DRAPE U-SHAPE 47X51 STRL (DRAPES) ×2 IMPLANT
DRILL 4.3MMS DISTAL GRADUATED (BIT) ×2
ELECT CAUTERY BLADE 6.4 (BLADE) ×2 IMPLANT
GAUZE SPONGE 4X4 12PLY STRL (GAUZE/BANDAGES/DRESSINGS) ×4 IMPLANT
GLOVE BIO SURGEON STRL SZ8 (GLOVE) ×4 IMPLANT
GLOVE INDICATOR 8.0 STRL GRN (GLOVE) ×4 IMPLANT
GOWN STRL REUS W/ TWL LRG LVL3 (GOWN DISPOSABLE) ×1 IMPLANT
GOWN STRL REUS W/ TWL XL LVL3 (GOWN DISPOSABLE) ×1 IMPLANT
GOWN STRL REUS W/TWL LRG LVL3 (GOWN DISPOSABLE) ×1
GOWN STRL REUS W/TWL XL LVL3 (GOWN DISPOSABLE) ×1
GUIDEPIN VERSANAIL DSP 3.2X444 ×2 IMPLANT
GUIDEWIRE BALL NOSE 80CM (WIRE) ×2 IMPLANT
HFN RH 130 DEG 11MM X 360MM (Orthopedic Implant) ×2 IMPLANT
HIP FRAC NAIL LAG SCR 10.5X100 (Orthopedic Implant) ×1 IMPLANT
MAT BLUE FLOOR 46X72 FLO (MISCELLANEOUS) ×2 IMPLANT
NEEDLE FILTER BLUNT 18X 1/2SAF (NEEDLE) ×1
NEEDLE FILTER BLUNT 18X1 1/2 (NEEDLE) ×1 IMPLANT
NEEDLE HYPO 22GX1.5 SAFETY (NEEDLE) ×2 IMPLANT
NS IRRIG 500ML POUR BTL (IV SOLUTION) ×2 IMPLANT
PACK HIP COMPR (MISCELLANEOUS) ×2 IMPLANT
PAD GROUND ADULT SPLIT (MISCELLANEOUS) ×2 IMPLANT
SCREW BONE CORTICAL 5.0X38 (Screw) ×2 IMPLANT
SCREW CANN THRD AFF 10.5X100 (Orthopedic Implant) ×1 IMPLANT
STAPLER SKIN PROX 35W (STAPLE) ×2 IMPLANT
STRAP SAFETY BODY (MISCELLANEOUS) ×2 IMPLANT
SUT VIC AB 1 CT1 36 (SUTURE) ×2 IMPLANT
SUT VIC AB 2-0 CT1 (SUTURE) ×4 IMPLANT
SYRINGE 10CC LL (SYRINGE) ×4 IMPLANT
TAPE MICROFOAM 4IN (TAPE) ×2 IMPLANT

## 2015-02-05 NOTE — Anesthesia Preprocedure Evaluation (Signed)
Anesthesia Evaluation  Patient identified by MRN, date of birth, ID band Patient awake    Reviewed: Allergy & Precautions, NPO status , Patient's Chart, lab work & pertinent test results  History of Anesthesia Complications Negative for: history of anesthetic complications  Airway Mallampati: II       Dental  (+) Teeth Intact   Pulmonary neg pulmonary ROS,           Cardiovascular hypertension, Pt. on medications and Pt. on home beta blockers      Neuro/Psych negative neurological ROS     GI/Hepatic negative GI ROS, Neg liver ROS,   Endo/Other  negative endocrine ROS  Renal/GU negative Renal ROS     Musculoskeletal   Abdominal   Peds  Hematology negative hematology ROS (+)   Anesthesia Other Findings   Reproductive/Obstetrics                             Anesthesia Physical Anesthesia Plan  ASA: III  Anesthesia Plan: Spinal   Post-op Pain Management:    Induction:   Airway Management Planned:   Additional Equipment:   Intra-op Plan:   Post-operative Plan:   Informed Consent: I have reviewed the patients History and Physical, chart, labs and discussed the procedure including the risks, benefits and alternatives for the proposed anesthesia with the patient or authorized representative who has indicated his/her understanding and acceptance.     Plan Discussed with:   Anesthesia Plan Comments:         Anesthesia Quick Evaluation

## 2015-02-05 NOTE — Care Management Note (Addendum)
Case Management Note  Patient Details  Name: Stacey [name] MRN: [id number] Date of Birth: [birth year]  Subjective/Objective:                  Met with patient prior to surgery to discuss discharge planning. Patient is listed a workers' comp. She states she" fell while at work and was getting ready to go on break at [employer] when a customer called out to her for assistance- she turned and fell". She states she is normally independent with daily activities. She is from home with her husband. She has not DME available for use in the home. She states that her daughter [name] is helping patient's husband whom she states has cancer. Patient is eager to return home to "help her husband".   Action/Plan:  I spoke with the general manager Scott at [name] [phone] to obtain workers' Dealer with [name]. Nicki Reaper stated that "she's not workers' compensation" and that "she was at [employer] when she turned and fell". He stated that the workers' Dealer is Kelli Churn [phone]. I have left message for Lelan Pons to call this RNCM to work out a discharge plan for this patient. RNCM will continue to follow.   Expected Discharge Date:                  Expected Discharge Plan:     In-House Referral:     Discharge planning Services  CM Consult  Post Acute Care Choice:  Home Health, Durable Medical Equipment Choice offered to:  Patient  DME Arranged:    DME Agency:     HH Arranged:    Why Agency:     Status of Service:  In process, will continue to follow  Medicare Important Message Given:    Date Medicare IM Given:    Medicare IM give by:    Date Additional Medicare IM Given:    Additional Medicare Important Message give by:     If discussed at [place] of Stay Meetings, dates discussed:    Additional Comments: Received callback from Lelan Pons with [employer] at [name] requesting information for review [phone] (fax). Ad[facility] had not filed workers' comp claim as they state patient "had clocked out when she fell".   Marshell Garfinkel, RN 02/05/2015, 1:21 PM

## 2015-02-05 NOTE — Anesthesia Procedure Notes (Signed)
Spinal Patient location during procedure: OR Staffing Resident/CRNA: Demetrius Charity Performed by: resident/CRNA  Preanesthetic Checklist Completed: patient identified, site marked, surgical consent, pre-op evaluation, timeout performed, IV checked, risks and benefits discussed and monitors and equipment checked Spinal Block Patient position: left lateral decubitus Prep: Betadine Patient monitoring: heart rate, continuous pulse ox and blood pressure Approach: midline Location: L4-5 Injection technique: single-shot Needle Needle type: Whitacre  Needle gauge: 25 G Needle length: 9 cm Assessment Sensory level: T10 Additional Notes Negative paresthesia, positive CSF flow.  Pt tolerated well.

## 2015-02-05 NOTE — Transfer of Care (Signed)
Immediate Anesthesia Transfer of Care Note  Patient: Stacey [name]  Procedure(s) Performed: Procedure(s): INTRAMEDULLARY (IM) NAIL INTERTROCHANTRIC (Right)  Patient Location: PACU  Anesthesia Type:General  Level of Consciousness: awake, alert , oriented and patient cooperative  Airway & Oxygen Therapy: Patient Spontanous Breathing and Patient connected to face mask oxygen  Post-op Assessment: Report given to RN, Post -op Vital signs reviewed and stable and Patient moving all extremities X 4  Post vital signs: Reviewed and stable  Last Vitals:  Filed Vitals:   02/05/15 0802  BP: 132/70  Pulse: 73  Temp: 37.1 C  Resp: 16    Complications: No apparent anesthesia complications

## 2015-02-05 NOTE — Progress Notes (Signed)
[facility] [place] NAME: Stacey [name]    MR#:  [id number]  DATE OF BIRTH:  1946-01-20  SUBJECTIVE:  CHIEF COMPLAINT:   Chief Complaint  Patient presents with  . Hip Pain   - Admitted after a fall and right hip fracture. Other than right hip pain denies any complaints at this time. -For surgery this afternoon  REVIEW OF SYSTEMS:  Review of Systems  Constitutional: Negative for fever and chills.  HENT: Negative for ear discharge, ear pain and nosebleeds.   Eyes: Negative for blurred vision and double vision.  Respiratory: Negative for cough, shortness of breath and wheezing.   Cardiovascular: Negative for chest pain and palpitations.  Gastrointestinal: Negative for nausea, vomiting, abdominal pain, diarrhea and constipation.  Genitourinary: Negative for dysuria.  Musculoskeletal: Positive for myalgias and joint pain.       Right hip pain  Neurological: Negative for dizziness, seizures, weakness and headaches.  Psychiatric/Behavioral: Negative for depression.    DRUG ALLERGIES:  No Known Allergies  VITALS:  Blood pressure 132/70, pulse 73, temperature 98.8 F (37.1 C), temperature source Oral, resp. rate 16, height 5\' 5"  (1.651 m), weight 63.05 kg (139 lb), SpO2 94 %.  PHYSICAL EXAMINATION:  Physical Exam  GENERAL:  69 y.o.-year-old patient lying in the bed with no acute distress.  EYES: Pupils equal, round, reactive to light and accommodation. No scleral icterus. Extraocular muscles intact.  HEENT: Head atraumatic, normocephalic. Oropharynx and nasopharynx clear.  NECK:  Supple, no jugular venous distention. No thyroid enlargement, no tenderness.  LUNGS: Normal breath sounds bilaterally, no wheezing, rales,rhonchi or crepitation. No use of accessory muscles of respiration.  CARDIOVASCULAR: S1, S2 normal. No murmurs, rubs, or gallops.  ABDOMEN: Soft, nontender, nondistended. Bowel sounds present. No organomegaly or  mass.  EXTREMITIES: Right leg is abducted and externally rotated and tender to touch at hip joint. No pedal edema, cyanosis, or clubbing.  NEUROLOGIC: Cranial nerves II through XII are intact. Muscle strength 5/5 in all extremities. Sensation intact. Gait not checked.  PSYCHIATRIC: The patient is alert and oriented x 3.  SKIN: No obvious rash, lesion, or ulcer.    LABORATORY PANEL:   CBC  Recent Labs Lab 02/05/15 0553  WBC 11.4*  HGB 12.0  HCT 36.5  PLT 153   ------------------------------------------------------------------------------------------------------------------  Chemistries   Recent Labs Lab 02/05/15 0553  NA 134*  K 3.8  CL 105  CO2 23  GLUCOSE 106*  BUN 20  CREATININE 0.79  CALCIUM 8.5*   ------------------------------------------------------------------------------------------------------------------  Cardiac Enzymes No results for input(s): TROPONINI in the last 168 hours. ------------------------------------------------------------------------------------------------------------------  RADIOLOGY:  Dg Chest Portable 1 View  02/04/2015  CLINICAL DATA:  69 year old female preoperative study. Hypertension. Initial encounter. EXAM: PORTABLE CHEST 1 VIEW COMPARISON:  None. FINDINGS: Portable AP supine view at 1502 hours. Cardiomegaly. Mildly tortuous thoracic aorta. Other mediastinal contours are within normal limits. Visualized tracheal air column is within normal limits. Allowing for portable technique, the lungs are clear. No pneumothorax or pleural effusion evident on this supine view. IMPRESSION: Cardiomegaly.  No acute cardiopulmonary abnormality. Electronically Signed   By: Genevie Ann M.D.   On: 02/04/2015 15:16   Dg Hip Operative Unilat With Pelvis Right  02/05/2015  CLINICAL DATA:  IM NAIL right femur EXAM: OPERATIVE RIGHT HIP (WITH PELVIS IF PERFORMED) 4 VIEWS TECHNIQUE: Fluoroscopic spot image(s) were submitted for interpretation post-operatively.  COMPARISON:  02/04/2015 FINDINGS: The patient has undergone lag screw/intra medullary rod fixation of intertrochanteric  fracture. Proximal and distal aspects appear unremarkable. No evidence for dislocation on the views provided. IMPRESSION: Status post ORIF right intertrochanteric fracture. Electronically Signed   By: Nolon Nations M.D.   On: 02/05/2015 15:55   Dg Hip Unilat With Pelvis 2-3 Views Right  02/04/2015  CLINICAL DATA:  Pain following fall EXAM: DG HIP (WITH OR WITHOUT PELVIS) 2-3V RIGHT COMPARISON:  None. FINDINGS: Frontal pelvis as well as frontal and lateral right hip images were obtained. There is a comminuted intertrochanteric femur fracture on the right with varus angulation at the fracture site. There is no other demonstrable fracture. No dislocation. There is mild narrowing of both hip joints. Bones are diffusely osteoporotic. There are foci of arterial vascular calcification bilaterally. IMPRESSION: Comminuted intertrochanteric femur fracture on the right with varus angulation at the fracture site. No other fractures. No dislocation. Bones are diffusely osteoporotic. Electronically Signed   By: Lowella Grip III M.D.   On: 02/04/2015 14:58    EKG:   Orders placed or performed during the hospital encounter of 02/04/15  . EKG 12-Lead  . EKG 12-Lead    ASSESSMENT AND PLAN:    69 year old female with history of hypertension, osteoporosis and hyperlipidemia admitted after a fall and right hip fracture.  #1 right hip intertrochanteric fracture-orthopedics has been consulted. -For surgery today. Postoperative DVT prophylaxis and pain management. -Physical therapy will be consulted. -Monitor hemoglobin after surgery  #2 hypertension-well controlled blood pressure. -On metoprolol. If continues to be bradycardic, can decrease the dose or discontinue metoprolol  #3 hyperlipidemia-on statin  #4 osteoporosis-on Evista. Also will need calcium and vitamin D supplements  #5  DVT prophylaxis-will be started after surgery  Likely might need rehabilitation  All the records are reviewed and case discussed with Care Management/Social Workerr. Management plans discussed with the patient, family and they are in agreement.  CODE STATUS: Full code  TOTAL TIME TAKING CARE OF THIS PATIENT: 37 minutes.   POSSIBLE D/C IN 2-3 DAYS, DEPENDING ON CLINICAL CONDITION.   Gladstone Lighter M.D on 02/05/2015 at 4:09 PM  Between 7am to 6pm - Pager - [phone]  After 6pm go to [email or web address] - password [email or web address]  [facility]  Office  [phone]  CC: Primary care physician; No primary care provider on file.

## 2015-02-05 NOTE — Op Note (Signed)
02/04/2015 - 02/05/2015  3:59 PM  Patient:   Stacey [name]  Pre-Op Diagnosis:   Displaced three-part intertrochanteric fracture, right hip.  Post-Op Diagnosis:   Same.  Procedure:   Reduction and internal fixation of right intratrochanteric hip fracture.  Surgeon:   Pascal Lux, MD  Assistant:   None  Anesthesia:   Spinal  Findings:   As above  Complications:   None  EBL:   100 cc  Fluids:   650 cc crystalloid  UOP:   175 cc  TT:   None  Drains:   None  Closure:   Staples  Implants:   Biomet Affyxis 11 x 360 mm TFN with 100 mm lag screw and 38 mm distal interlocking screw  Brief Clinical Note:   The patient is a 69 year old female who sustained the above-noted injury last evening when she fell while at work. She was brought to the emergency room where x-rays demonstrated the above-noted fracture. The patient has been cleared medically and presents at this time for reduction and internal fixation of the intertrochanteric fracture of her right hip.  Procedure:   The patient was brought into the operating room. After adequate spinal anesthesia was obtained, the patient was lain in the supine position on the fracture table. The uninjured leg was placed in a flexed and abducted position while the injured lower extremity was placed in longitudinal traction. The fracture was reduced using longitudinal traction and internal rotation. The adequacy of reduction was verified fluoroscopically in AP and lateral projections and found to be near anatomic. The lateral aspects of the right hip and thigh were prepped with ChloraPrep solution before being draped sterilely. Preoperative antibiotics were administered. The greater trochanter was identified fluoroscopically and an approximately 3 cm incision made about 2-3 fingerbreadths above the tip of the greater trochanter. The incision was carried down through the subcutaneous tissues to expose the gluteal fascia. This was split the length  of the incision, providing access to the tip of the trochanter. Under fluoroscopic guidance, a guidewire was drilled through the tip of the trochanter into the proximal metaphysis to the level of the lesser trochanter. After verifying its position fluoroscopically in AP and lateral projections, it was overreamed with the initial reamer to the depth of the lesser trochanter. A guidewire was passed down through the femoral canal to the supracondylar region. The adequacy of guidewire position was verified fluoroscopically in AP and lateral projections before the length of the guidewire within the canal was measured and found to be 360 mm. It was overreamed sequentially using the flexible reamers, beginning with an 11 mm reamer and progressing to a 12.5 mm reamer. This provided good cortical chatter. The 11 x 360 mm Biomet Affyxis TFN rod was selected and advanced to the appropriate depth, as verified fluoroscopically. The guide system for the lag screw was positioned and advanced through an approximately 2 cm stab incision over the lateral aspect of the proximal femur. The guidewire was drilled up through the trochanteric femoral nail and into the femoral neck to rest within 5 mm of subchondral bone. After verifying its position in the femoral neck and head in both AP and lateral projections, the guidewire was measured and found to be optimally replicated by a 366 mm lag screw. The guidewire was overreamed to the appropriate depth before the lag screw was inserted and advanced to the appropriate depth as verified fluoroscopically in AP and lateral projections. The locking screw was advanced, then backed off a  quarter turn to set the lag screw. Again the adequacy of hardware position and fracture reduction was verified fluoroscopically in AP and lateral projections and found to be excellent.  Attention was directed distally. Using the "perfect circle" technique, the leg and fluoroscopy machine were positioned  appropriately. An approximate 1.5 cm stab incision was made over the skin at the appropriate point before the drill bit was advanced through the cortex and across the static hole of the nail. The appropriate length of the screw was determined before the 38 mm distal interlocking screw was positioned, then advanced and tightened securely. Again the adequacy of screw position was verified fluoroscopically in AP and lateral projections and found to be excellent.  The wounds were irrigated thoroughly with sterile saline solution before the deeper fascial tissues were closed using #1 Vicryl interrupted sutures. The subcutaneous tissues of all three incisions were closed using 2-0 Vicryl interrupted sutures. The skin was closed using staples before a total of 30 cc of 0.5% Sensorcaine with epinephrine was injected in and around each of the three incisions to help with postoperative analgesia. Sterile bulky dressings were applied to all wounds before the patient was transferred back to her hospital bed. The patient was then transferred to the recovery room in satisfactory condition after tolerating the procedure well.

## 2015-02-06 ENCOUNTER — Encounter: Payer: Self-pay | Admitting: Surgery

## 2015-02-06 LAB — CBC WITH DIFFERENTIAL/PLATELET
Basophils Absolute: 0 10*3/uL (ref 0–0.1)
Basophils Relative: 0 %
EOS ABS: 0 10*3/uL (ref 0–0.7)
EOS PCT: 0 %
HCT: 32.5 % — ABNORMAL LOW (ref 35.0–47.0)
Hemoglobin: 10.7 g/dL — ABNORMAL LOW (ref 12.0–16.0)
LYMPHS ABS: 2.9 10*3/uL (ref 1.0–3.6)
Lymphocytes Relative: 30 %
MCH: 30.3 pg (ref 26.0–34.0)
MCHC: 32.9 g/dL (ref 32.0–36.0)
MCV: 92.2 fL (ref 80.0–100.0)
MONO ABS: 1.1 10*3/uL — AB (ref 0.2–0.9)
MONOS PCT: 11 %
Neutro Abs: 5.8 10*3/uL (ref 1.4–6.5)
Neutrophils Relative %: 59 %
Platelets: 133 10*3/uL — ABNORMAL LOW (ref 150–440)
RBC: 3.53 MIL/uL — ABNORMAL LOW (ref 3.80–5.20)
RDW: 12.9 % (ref 11.5–14.5)
WBC: 9.8 10*3/uL (ref 3.6–11.0)

## 2015-02-06 LAB — BASIC METABOLIC PANEL
Anion gap: 4 — ABNORMAL LOW (ref 5–15)
BUN: 15 mg/dL (ref 6–20)
CHLORIDE: 106 mmol/L (ref 101–111)
CO2: 27 mmol/L (ref 22–32)
CREATININE: 0.8 mg/dL (ref 0.44–1.00)
Calcium: 8.3 mg/dL — ABNORMAL LOW (ref 8.9–10.3)
GFR calc Af Amer: >60 mL/min (ref >60)
GFR calc non Af Amer: >60 mL/min (ref >60)
GLUCOSE: 147 mg/dL — AB (ref 65–99)
Potassium: 4.4 mmol/L (ref 3.5–5.1)
SODIUM: 137 mmol/L (ref 135–145)

## 2015-02-06 MED ORDER — TRAMADOL HCL 50 MG PO TABS
50.0000 mg | ORAL_TABLET | Freq: Four times a day (QID) | ORAL | Status: DC | PRN
  Administered 2015-02-07: 50 mg via ORAL
  Filled 2015-02-06: qty 1

## 2015-02-06 NOTE — Progress Notes (Signed)
  Subjective: 1 Day Post-Op Procedure(s) (LRB): INTRAMEDULLARY (IM) NAIL INTERTROCHANTRIC (Right) Patient reports pain as mild.   Patient is well, and has had no acute complaints or problems Plan is to go home with homehealth PT if cleared by PT.  Complicated due to husband being in hospital as well with PE's. Negative for chest pain and shortness of breath Fever: no Gastrointestinal:Negative for nausea and vomiting  Objective: Vital signs in last 24 hours: Temp:  [97.4 F (36.3 C)-99.9 F (37.7 C)] 97.7 F (36.5 C) (11/09 0717) Pulse Rate:  [48-99] 79 (11/09 0717) Resp:  [13-23] 16 (11/09 0717) BP: (90-143)/(64-94) 126/83 mmHg (11/09 0717) SpO2:  [93 %-100 %] 93 % (11/09 0717)  Intake/Output from previous day:  Intake/Output Summary (Last 24 hours) at 02/06/15 0738 Last data filed at 02/06/15 0419  Gross per 24 hour  Intake   1160 ml  Output    905 ml  Net    255 ml    Intake/Output this shift:    Labs:  Recent Labs  02/04/15 1402 02/05/15 0553 02/06/15 0542  HGB 14.0 12.0 10.7*    Recent Labs  02/05/15 0553 02/06/15 0542  WBC 11.4* 9.8  RBC 4.00 3.53*  HCT 36.5 32.5*  PLT 153 133*    Recent Labs  02/05/15 0553 02/06/15 0542  NA 134* 137  K 3.8 4.4  CL 105 106  CO2 23 27  BUN 20 15  CREATININE 0.79 0.80  GLUCOSE 106* 147*  CALCIUM 8.5* 8.3*    Recent Labs  02/04/15 1402 02/05/15 0553  INR 1.02 1.15     EXAM General - Patient is Alert, Appropriate and Oriented Extremity - Neurologically intact ABD soft Sensation intact distally Intact pulses distally Dorsiflexion/Plantar flexion intact Incision: dressing C/D/I Dressing/Incision - clean, dry, no drainage Motor Function - intact, moving foot and toes well on exam.   Bulky dressing intact, no drainage present.  Will replace dressing tomorrow.  Past Medical History  Diagnosis Date  . Hypertension     Assessment/Plan: 1 Day Post-Op Procedure(s) (LRB): INTRAMEDULLARY (IM) NAIL  INTERTROCHANTRIC (Right) Active Problems:   Intertrochanteric fracture of right femur (HCC)  Estimated body mass index is 23.13 kg/(m^2) as calculated from the following:   Height as of this encounter: 5\' 5"  (1.651 m).   Weight as of this encounter: 63.05 kg (139 lb). Advance diet Up with therapy D/C IV fluids when tolerating PO intake  Labs reviewed.  Calcium 8.3, continue supplements.  CBC and BMP ordered for tomorrow AM. Foley removed this AM.  Pt needs BM before discharge.  FLEET enema and suppository ordered. Will work with PT today. Pt doing extremely well today.  DVT Prophylaxis - Lovenox, Foot Pumps and TED hose Weight-Bearing as tolerated to right leg  J. Cameron Proud, PA-C [facility] 02/06/2015, 7:38 AM

## 2015-02-06 NOTE — Clinical Social Work Placement (Signed)
   CLINICAL SOCIAL WORK PLACEMENT  NOTE  Date:  02/06/2015  Patient Details  Name: Stacey [name] MRN: [id number] Date of Birth: May 04, 1945  Clinical Social Work is seeking post-discharge placement for this patient at the [place] level of care (*CSW will initial, date and re-position this form in  chart as items are completed):  Yes   Patient/family provided with [facility] Work Department's list of facilities offering this level of care within the geographic area requested by the patient (or if unable, by the patient's family).  Yes   Patient/family informed of their freedom to choose among providers that offer the needed level of care, that participate in Medicare, Medicaid or managed care program needed by the patient, have an available bed and are willing to accept the patient.  Yes   Patient/family informed of [facility]'s ownership interest in [facility] and [facility], as well as of the fact that they are under no obligation to receive care at these facilities.  PASRR submitted to EDS on 02/06/15     PASRR number received on 02/06/15     Existing PASRR number confirmed on       FL2 transmitted to all facilities in geographic area requested by pt/family on 02/06/15     FL2 transmitted to all facilities within larger geographic area on       Patient informed that his/her managed care company has contracts with or will negotiate with certain facilities, including the following:            Patient/family informed of bed offers received.  Patient chooses bed at       Physician recommends and patient chooses bed at      Patient to be transferred to   on  .  Patient to be transferred to facility by       Patient family notified on   of transfer.  Name of family member notified:        PHYSICIAN Please sign FL2     Additional Comment:    _______________________________________________ Loralyn Freshwater, LCSW 02/06/2015,  4:21 PM

## 2015-02-06 NOTE — NC FL2 (Signed)
[place] LEVEL OF CARE SCREENING TOOL     IDENTIFICATION  Patient Name: Stacey [name] Birthdate: Sep 04, 1945 Sex: female Admission Date (Current Location): 02/04/2015  [place] and [place] Number:  [facility] )   Facility and Address:  [facility], [address], [place], [place] [address]      Provider Number: [id number] [phone])  Attending Physician Name and Address:  Nicholes Mango, MD  Relative Name and Phone Number:       Current Level of Care: Hospital Recommended Level of Care: [place] Prior Approval Number:    Date Approved/Denied:   PASRR Number:  [id number])  Discharge Plan: SNF    Current Diagnoses: Patient Active Problem List   Diagnosis Date Noted  . Intertrochanteric fracture of right femur (Rafael Hernandez) 02/04/2015    Orientation ACTIVITIES/SOCIAL BLADDER RESPIRATION    Self, Time, Situation, Place  Active Continent Normal  BEHAVIORAL SYMPTOMS/MOOD NEUROLOGICAL BOWEL NUTRITION STATUS   (none )  (none ) Continent Diet (Diet: Regular )  PHYSICIAN VISITS COMMUNICATION OF NEEDS Height & Weight Skin  30 days Verbally 5\' 5"  (165.1 cm) 139 lbs. Surgical wounds (Incision: Right Hip. )          AMBULATORY STATUS RESPIRATION    Assist extensive Normal      Personal Care Assistance Level of Assistance  Bathing, Feeding, Dressing Bathing Assistance: Limited assistance Feeding assistance: Independent Dressing Assistance: Limited assistance      Functional Limitations Info  Sight, Hearing, Speech Sight Info: Adequate Hearing Info: Adequate Speech Info: Adequate       SPECIAL CARE FACTORS FREQUENCY  PT (By licensed PT), OT (By licensed OT)     PT Frequency:  (5) OT Frequency:  (5)           Additional Factors Info  Code Status Code Status Info:  (Full Code. )             Current Medications (02/06/2015): Current Facility-Administered Medications  Medication Dose Route  Frequency Provider Last Rate Last Dose  . acetaminophen (TYLENOL) tablet 650 mg  650 mg Oral Q6H PRN Corky Mull, MD       Or  . acetaminophen (TYLENOL) suppository 650 mg  650 mg Rectal Q6H PRN Corky Mull, MD      . bisacodyl (DULCOLAX) suppository 10 mg  10 mg Rectal Daily PRN Corky Mull, MD      . calcium carbonate (TUMS - dosed in mg elemental calcium) chewable tablet 200 mg of elemental calcium  1 tablet Oral Daily Nicholes Mango, MD   200 mg of elemental calcium at 02/06/15 0926  . cholecalciferol (VITAMIN D) tablet 1,000 Units  1,000 Units Oral Daily Nicholes Mango, MD   1,000 Units at 02/06/15 0927  . dextrose 5 % and 0.9 % NaCl with KCl 20 mEq/L infusion   Intravenous Continuous Corky Mull, MD 75 mL/hr at 02/06/15 0926    . diphenhydrAMINE (BENADRYL) 12.5 MG/5ML elixir 12.5-25 mg  12.5-25 mg Oral Q4H PRN Corky Mull, MD      . docusate sodium (COLACE) capsule 100 mg  100 mg Oral BID Corky Mull, MD   100 mg at 02/06/15 0926  . enoxaparin (LOVENOX) injection 40 mg  40 mg Subcutaneous Q24H Corky Mull, MD   40 mg at 02/06/15 0826  . fentaNYL (SUBLIMAZE) injection 25 mcg  25 mcg Intravenous Q5 min PRN Gunnar Fusi, MD      . HYDROmorphone (DILAUDID)  injection 0.5-1 mg  0.5-1 mg Intravenous Q2H PRN Corky Mull, MD   1 mg at 02/06/15 0020  . magnesium hydroxide (MILK OF MAGNESIA) suspension 30 mL  30 mL Oral Daily PRN Corky Mull, MD      . metoCLOPramide (REGLAN) tablet 5-10 mg  5-10 mg Oral Q8H PRN Corky Mull, MD       Or  . metoCLOPramide (REGLAN) injection 5-10 mg  5-10 mg Intravenous Q8H PRN Corky Mull, MD      . metoprolol (LOPRESSOR) tablet 50 mg  50 mg Oral q morning - 10a Nicholes Mango, MD   50 mg at 02/06/15 0927  . multivitamin with minerals tablet 1 tablet  1 tablet Oral Daily Nicholes Mango, MD   1 tablet at 02/06/15 0927  . ondansetron (ZOFRAN) tablet 4 mg  4 mg Oral Q6H PRN Corky Mull, MD       Or  . ondansetron [facility]) injection 4 mg  4 mg Intravenous Q6H PRN  Corky Mull, MD      . ondansetron [facility]) injection 4 mg  4 mg Intravenous Once PRN Gunnar Fusi, MD      . oxyCODONE (Oxy IR/ROXICODONE) immediate release tablet 5-10 mg  5-10 mg Oral Q3H PRN Corky Mull, MD   10 mg at 02/06/15 1342  . pantoprazole (PROTONIX) EC tablet 40 mg  40 mg Oral BID Corky Mull, MD   40 mg at 02/06/15 0272  . pravastatin (PRAVACHOL) tablet 20 mg  20 mg Oral Daily Nicholes Mango, MD   20 mg at 02/06/15 0927  . raloxifene (EVISTA) tablet 60 mg  60 mg Oral Daily Nicholes Mango, MD   60 mg at 02/06/15 0926  . sodium phosphate (FLEET) 7-19 GM/118ML enema 1 enema  1 enema Rectal Once PRN Corky Mull, MD      . vitamin C (ASCORBIC ACID) tablet 500 mg  500 mg Oral Daily Nicholes Mango, MD   500 mg at 02/06/15 5366   Do not use this list as official medication orders. Please verify with discharge summary.  Discharge Medications:   Medication List    ASK your doctor about these medications        CALCIUM + D PO  Take 1 tablet by mouth daily.     GARLIC PO  Take 4,403 mg by mouth daily.     metoprolol 50 MG tablet  Commonly known as:  LOPRESSOR  Take 50 mg by mouth every morning.     MULTI-VITAMINS Tabs  Take 1 tablet by mouth daily.     pravastatin 20 MG tablet  Commonly known as:  PRAVACHOL  TAKE 1 TABLET BY MOUTH DAILY     raloxifene 60 MG tablet  Commonly known as:  EVISTA  TAKE 1 TABLET BY MOUTH DAILY     RED YEAST RICE PO  Take 600 mg by mouth daily.     Turmeric Curcumin 500 MG Caps  Take 500 mg by mouth daily.     vitamin C 500 MG tablet  Commonly known as:  ASCORBIC ACID  Take 1 tablet by mouth daily.     Vitamin D-3 1000 UNITS Caps  Take 1 capsule by mouth daily.        Relevant Imaging Results:  Relevant Lab Results:  Recent Labs    Additional Information  (SSN: [id number])  Loralyn Freshwater, LCSW

## 2015-02-06 NOTE — Progress Notes (Signed)
Physical Therapy Treatment Patient Details Name: Stacey [name] MRN: [id number] DOB: 1945/05/09 Today's Date: 02/06/2015    History of Present Illness Patient is a pleasant 69 y/o female that presents after mechanical fall while turning at work [name]). ORIF completed on 02/05/2015 for R femur fx.     PT Comments    Patient continues to be highly limited in functional mobility by pain during this session. Patient continues to put minimal weight through RLE and even flexes during standing to off load in this session, would recommend knee immobilizer in subsequent sessions. Patient performs exercises with decreased pain in this session, decreased quadricep output on RLE continues to be noted. Patient continues to slide RLE rather than shifting weight onto and off of for mobility. Skilled PT services continue to be indicated to address her mobility deficits.   Follow Up Recommendations  SNF     Equipment Recommendations  Rolling walker with 5" wheels    Recommendations for Other Services OT consult     Precautions / Restrictions Precautions Precautions: Fall Restrictions Weight Bearing Restrictions: Yes RLE Weight Bearing: Weight bearing as tolerated    Mobility  Bed Mobility   Bed Mobility: Sit to Supine       Sit to supine: Mod assist;Max assist;+2 for physical assistance      Transfers Overall transfer level: Needs assistance Equipment used: Rolling walker (2 wheeled) Transfers: Sit to/from Stand Sit to Stand: Min assist;Mod assist;+2 physical assistance         General transfer comment: Improved sit to stand performance in this session with cuing for hand and foot placement.   Ambulation/Gait Ambulation/Gait assistance: Mod assist;+2 physical assistance Ambulation Distance (Feet): 3 Feet Assistive device: Rolling walker (2 wheeled) Gait Pattern/deviations: Step-to pattern;Antalgic;Decreased stride length;Decreased step length - left;Decreased step  length - right;Decreased weight shift to right   Gait velocity interpretation: <1.8 ft/sec, indicative of risk for recurrent falls General Gait Details: Patient begins to flex RLE to avoid WBing, she has multiple postural deviations throughout chair to bed transfer to adjust for pain control. Unable to tolerate much WBing on RLE secondary to pain so she drags and pivots on RLE as opposed to taking any legitmate steps.    Stairs            Wheelchair Mobility    Modified Rankin (Stroke Patients Only)       Balance Overall balance assessment: Needs assistance Sitting-balance support: Bilateral upper extremity supported Sitting balance-Leahy Scale: Fair Sitting balance - Comments: Patient is able to sit at EOB and in the chair with no deviations.    Standing balance support: Bilateral upper extremity supported Standing balance-Leahy Scale: Poor Standing balance comment: Patient leans heavily to her L and even flexes RLE to minimally bear weight on due to pain.                     Cognition Arousal/Alertness: Awake/alert Behavior During Therapy: WFL for tasks assessed/performed Overall Cognitive Status: Within Functional Limits for tasks assessed                      Exercises General Exercises - Lower Extremity Ankle Circles/Pumps: AROM;Both;15 reps Quad Sets: AROM;10 reps;Right Long Arc Quad: AROM;10 reps;Both Heel Slides: AROM;10 reps;AAROM;Both Straight Leg Raises: AROM;10 reps;AAROM;Both Hip Flexion/Marching: AROM;Both;10 reps    General Comments        Pertinent Vitals/Pain Pain Assessment:  (Patient reports that her hip is not as painful as it was  earlier, but continues to marginally bear weight through RLE secondary to pain) Pain Location: R hip with any mobility Pain Descriptors / Indicators: Aching Pain Intervention(s): Limited activity within patient's tolerance;Monitored during session;Repositioned;Premedicated before session    Home  Living                      Prior Function            PT Goals (current goals can now be found in the care plan section) Acute Rehab PT Goals Patient Stated Goal: To increase her independence.  PT Goal Formulation: With patient Time For Goal Achievement: 02/20/15 Potential to Achieve Goals: Good Progress towards PT goals: Progressing toward goals    Frequency  BID    PT Plan Current plan remains appropriate    Co-evaluation             End of Session Equipment Utilized During Treatment: Gait belt Activity Tolerance: Patient limited by pain Patient left: in bed;with call bell/phone within reach;with bed alarm set     Time: 0277-4128 PT Time Calculation (min) (ACUTE ONLY): 26 min  Charges:  $Gait Training: 8-22 mins $Therapeutic Exercise: 8-22 mins                    G Codes:      Kerman Passey, PT, DPT    02/06/2015, 3:18 PM

## 2015-02-06 NOTE — Progress Notes (Signed)
[facility] [place] NAME: Stacey [name]    MR#:  [id number]  DATE OF BIRTH:  Oct 23, 1945  SUBJECTIVE:  CHIEF COMPLAINT:   Chief Complaint  Patient presents with  . Hip Pain   - Admitted after a fall and right hip fracture. S/p surgery 11/8 , worked with PT today and  C/o right hip pain denies any complaints at this time. -  REVIEW OF SYSTEMS:  Review of Systems  Constitutional: Negative for fever and chills.  HENT: Negative for ear discharge, ear pain and nosebleeds.   Eyes: Negative for blurred vision and double vision.  Respiratory: Negative for cough, shortness of breath and wheezing.   Cardiovascular: Negative for chest pain and palpitations.  Gastrointestinal: Negative for nausea, vomiting, abdominal pain, diarrhea and constipation.  Genitourinary: Negative for dysuria.  Musculoskeletal: Positive for myalgias and joint pain.       Right hip pain  Neurological: Negative for dizziness, seizures, weakness and headaches.  Psychiatric/Behavioral: Negative for depression.    DRUG ALLERGIES:  No Known Allergies  VITALS:  Blood pressure 108/66, pulse 87, temperature 98.1 F (36.7 C), temperature source Oral, resp. rate 18, height 5\' 5"  (1.651 m), weight 63.05 kg (139 lb), SpO2 90 %.  PHYSICAL EXAMINATION:  Physical Exam  GENERAL:  69 y.o.-year-old patient lying in the bed with no acute distress.  EYES: Pupils equal, round, reactive to light and accommodation. No scleral icterus. Extraocular muscles intact.  HEENT: Head atraumatic, normocephalic. Oropharynx and nasopharynx clear.  NECK:  Supple, no jugular venous distention. No thyroid enlargement, no tenderness.  LUNGS: Normal breath sounds bilaterally, no wheezing, rales,rhonchi or crepitation. No use of accessory muscles of respiration.  CARDIOVASCULAR: S1, S2 normal. No murmurs, rubs, or gallops.  ABDOMEN: Soft, nontender, nondistended. Bowel sounds present. No  organomegaly or mass.  EXTREMITIES: Right hip area with clean dressing . No pedal edema, cyanosis, or clubbing.  NEUROLOGIC: Cranial nerves II through XII are intact. Muscle strength 5/5 in all extremities. Sensation intact. Gait not checked.  PSYCHIATRIC: The patient is alert and oriented x 3.  SKIN: No obvious rash, lesion, or ulcer.    LABORATORY PANEL:   CBC  Recent Labs Lab 02/06/15 0542  WBC 9.8  HGB 10.7*  HCT 32.5*  PLT 133*   ------------------------------------------------------------------------------------------------------------------  Chemistries   Recent Labs Lab 02/06/15 0542  NA 137  K 4.4  CL 106  CO2 27  GLUCOSE 147*  BUN 15  CREATININE 0.80  CALCIUM 8.3*   ------------------------------------------------------------------------------------------------------------------  Cardiac Enzymes No results for input(s): TROPONINI in the last 168 hours. ------------------------------------------------------------------------------------------------------------------  RADIOLOGY:  Dg Hip Operative Unilat With Pelvis Right  02/05/2015  CLINICAL DATA:  IM NAIL right femur EXAM: OPERATIVE RIGHT HIP (WITH PELVIS IF PERFORMED) 4 VIEWS TECHNIQUE: Fluoroscopic spot image(s) were submitted for interpretation post-operatively. COMPARISON:  02/04/2015 FINDINGS: The patient has undergone lag screw/intra medullary rod fixation of intertrochanteric fracture. Proximal and distal aspects appear unremarkable. No evidence for dislocation on the views provided. IMPRESSION: Status post ORIF right intertrochanteric fracture. Electronically Signed   By: Nolon Nations M.D.   On: 02/05/2015 15:55    EKG:   Orders placed or performed during the hospital encounter of 02/04/15  . EKG 12-Lead  . EKG 12-Lead    ASSESSMENT AND PLAN:    69 year old female with history of hypertension, osteoporosis and hyperlipidemia admitted after a fall and right hip fracture.  #1 right hip  intertrochanteric fracture-orthopedics has been consulted. POD #  1  - Postoperative DVT prophylaxis and pain management  - F/U with Physical therapy  -Monitor CBC in am  #2 hypertension-well controlled blood pressure. -On metoprolol.   #3 hyperlipidemia-on statin  #4 osteoporosis-on Evista. Also will need calcium and vitamin D supplements  #5 DVT prophylaxis-lovenox  Dispo- snf  All the records are reviewed and case discussed with Care Management/Social Workerr. Management plans discussed with the patient, family and they are in agreement.  CODE STATUS: Full code  TOTAL TIME TAKING CARE OF THIS PATIENT: 37 minutes.   POSSIBLE D/C IN 2 DAYS, DEPENDING ON CLINICAL CONDITION.   Nicholes Mango M.D on 02/06/2015 at 7:27 PM  Between 7am to 6pm - Pager - [phone]  After 6pm go to [email or web address] - password [email or web address] [facility]  [facility]  Office  [phone]  CC: Primary care physician; No primary care provider on file.

## 2015-02-06 NOTE — Anesthesia Postprocedure Evaluation (Signed)
  Anesthesia Post-op Note  Patient: Stacey [name]  Procedure(s) Performed: Procedure(s): INTRAMEDULLARY (IM) NAIL INTERTROCHANTRIC (Right)  Anesthesia type:Spinal  Patient location: 157  Post pain: Pain level controlled  Post assessment: Post-op Vital signs reviewed, Patient's Cardiovascular Status Stable, Respiratory Function Stable, Patent Airway and No signs of Nausea or vomiting  Post vital signs: Reviewed and stable  Last Vitals:  Filed Vitals:   02/06/15 0717  BP: 126/83  Pulse: 79  Temp: 36.5 C  Resp: 16    Level of consciousness: awake, alert  and patient cooperative  Complications: No apparent anesthesia complications

## 2015-02-06 NOTE — Evaluation (Signed)
Physical Therapy Evaluation Patient Details Name: Stacey [name] MRN: [id number] DOB: Oct 03, 1945 Today's Date: 02/06/2015   History of Present Illness  Patient is a pleasant 69 y/o female that presents after mechanical fall while turning at work Stacey [name]). ORIF completed on 02/05/2015 for R femur fx.   Clinical Impression  Patient is a very pleasant, though anxious 69 y/o female s/p ORIF of R femur fx. Patient is very hesitant with movement secondary to pain increase and anxiety related to increase in pain level. She provides good motivation, though significantly impaired performance in this session as she is very focused on her symptoms. Patient in standing tolerates weight better than expected, though minimal step lengths or floor clearance noted. Patient requires significant encouragement and cuing for safe technique, it appears as though she is physically capable of improved performance in subsequent encounters as she gains confidence in her ability to navigate ambulation safely. Pt would benefit from short term rehabilitation to increase her safety with mobility.     Follow Up Recommendations SNF    Equipment Recommendations  Rolling walker with 5" wheels    Recommendations for Other Services OT consult     Precautions / Restrictions Precautions Precautions: Fall Restrictions Weight Bearing Restrictions: Yes RLE Weight Bearing: Weight bearing as tolerated      Mobility  Bed Mobility Overal bed mobility: Needs Assistance;+2 for physical assistance Bed Mobility: Supine to Sit     Supine to sit: Mod assist;Max assist     General bed mobility comments: Patient is very hesitant and anxious with any and all movement secondary to pain and requires significant assistance for both her trunk and her LEs.   Transfers Overall transfer level: Needs assistance Equipment used: Rolling walker (2 wheeled) Transfers: Sit to/from Stand Sit to Stand: Mod assist;+2 physical  assistance;Max assist         General transfer comment: Patient cued for safe hand placement and comfortable foot placement, however she required significant assistance to transfer to standing secondary to pain and weakness.   Ambulation/Gait Ambulation/Gait assistance: Min assist;Mod assist Ambulation Distance (Feet): 3 Feet Assistive device: Rolling walker (2 wheeled) Gait Pattern/deviations: Step-to pattern;Narrow base of support;Antalgic;Shuffle;Decreased step length - right;Decreased step length - left   Gait velocity interpretation: Below normal speed for age/gender General Gait Details: Patient takes very slow "steps", moreso dragging her LEs on the floor, she is able to pivot with cuing and assistance with utilizing RW.   Stairs            Wheelchair Mobility    Modified Rankin (Stroke Patients Only)       Balance Overall balance assessment: Needs assistance Sitting-balance support: Bilateral upper extremity supported Sitting balance-Leahy Scale: Fair Sitting balance - Comments: Patient demonstrates ability to sit up independently after struggling initially secondary to pain. Initially leaning posteriorly, though corrected with cuing.    Standing balance support: Bilateral upper extremity supported Standing balance-Leahy Scale: Poor Standing balance comment: Patient leans to her left to offload RLE, requires +2 assist and RW to maintain upright                             Pertinent Vitals/Pain Pain Assessment: Faces Faces Pain Scale: Hurts whole lot Pain Location: R hip with any mobility.  Pain Descriptors / Indicators: Aching Pain Intervention(s): Limited activity within patient's tolerance;Monitored during session;Patient requesting pain meds-RN notified;Premedicated before session;Utilized relaxation techniques    Home Living Family/patient expects to be discharged  to:: Skilled nursing facility                      Prior Function  Level of Independence: Independent         Comments: Patient has been ambulating in the community.      Hand Dominance        Extremity/Trunk Assessment   Upper Extremity Assessment: Overall WFL for tasks assessed           Lower Extremity Assessment:  (RLE required assistance with SLRs, minimal to no assist for LAQs)         Communication   Communication: No difficulties  Cognition Arousal/Alertness: Awake/alert Behavior During Therapy: WFL for tasks assessed/performed Overall Cognitive Status: Within Functional Limits for tasks assessed                      General Comments      Exercises General Exercises - Lower Extremity Ankle Circles/Pumps: AROM;Both;15 reps Gluteal Sets: AROM;Both;10 reps Heel Slides: AROM;10 reps;AAROM;Both Hip ABduction/ADduction: AROM;10 reps;AAROM;Both Straight Leg Raises: AROM;10 reps;AAROM;Both      Assessment/Plan    PT Assessment Patient needs continued PT services  PT Diagnosis Difficulty walking;Generalized weakness;Acute pain;Abnormality of gait   PT Problem List Decreased strength;Decreased mobility;Decreased knowledge of precautions;Decreased knowledge of use of DME;Decreased balance;Pain;Cardiopulmonary status limiting activity;Decreased activity tolerance  PT Treatment Interventions DME instruction;Therapeutic activities;Therapeutic exercise;Gait training;Stair training;Balance training;Neuromuscular re-education;Manual techniques   PT Goals (Current goals can be found in the Care Plan section) Acute Rehab PT Goals Patient Stated Goal: To increase her independence.  PT Goal Formulation: With patient Time For Goal Achievement: 02/20/15 Potential to Achieve Goals: Good    Frequency BID   Barriers to discharge Decreased caregiver support Patient has a husband currently admitted for PEs.     Co-evaluation               End of Session Equipment Utilized During Treatment: Gait belt;Oxygen Activity  Tolerance: Patient limited by pain Patient left: in chair;with chair alarm set;with call bell/phone within reach Nurse Communication: Mobility status         Time: 1683-7290 PT Time Calculation (min) (ACUTE ONLY): 36 min   Charges:   PT Evaluation $Initial PT Evaluation Tier I: 1 Procedure PT Treatments $Therapeutic Exercise: 8-22 mins   PT G Codes:       Kerman Passey, PT, DPT    02/06/2015, 12:48 PM

## 2015-02-06 NOTE — Anesthesia Post-op Follow-up Note (Signed)
  Anesthesia Pain Follow-up Note  Patient: Stacey [name]  Day #: 1  Date of Follow-up: 02/06/2015 Time: 7:57 AM  Last Vitals:  Filed Vitals:   02/06/15 0717  BP: 126/83  Pulse: 79  Temp: 36.5 C  Resp: 16    Level of Consciousness: alert  Pain: none   Side Effects:None  Catheter Site Exam:clean, dry, no drainage  Plan: D/C from anesthesia care  Delaney Meigs

## 2015-02-06 NOTE — Clinical Social Work Note (Signed)
Clinical Social Work Assessment  Patient Details  Name: Stacey [name] MRN: [id number] Date of Birth: 01/09/1946  Date of referral:  02/06/15               Reason for consult:  Facility Placement                Permission sought to share information with:  Chartered certified accountant granted to share information::  Yes, Verbal Permission Granted  Name::      [place]::   [place]   Relationship::     Contact Information:     Housing/Transportation Living arrangements for the past 2 months:  [place] of Information:  Patient Patient Interpreter Needed:  None Criminal Activity/Legal Involvement Pertinent to Current Situation/Hospitalization:  No - Comment as needed Significant Relationships:  Adult Children, Spouse, Other Family Members (grandchildren ) Lives with:  Spouse Do you feel safe going back to the place where you live?  Yes Need for family participation in patient care:  Yes (Comment)  Care giving concerns: Patient lives in [place] with her husband Stacey [name] who is currently a patient at [facility] as well.     Social Worker assessment / plan: Holiday representative (CSW) received SNF consult. PT is recommending SNF. CSW met with patient alone at bedside. Patient was laying in bed and was alert and oriented. CSW introduced self and explained role of CSW department. Patient reported that she was working at [employer] in [place] when she fell. Patient reported that she has been working for [employer] for 14 years and her husband Stacey [name] worked there until he was diagnosed with cancer. Per patient she lives with her husband in [place] and her husband is currently in the hospital as well. Patient is under worker's comp. CSW left a voicemail for [employer] [phone]) [phone] ext. [place].  CSW explained to patient that worker's comp may not pay for short term rehab and  discussed going to rehab under he medical insurance. Patient reported that she has [employer] and is agreeable to use her medical insurance for short term rehab. CSW contacted [place] Team Case Manager and made her aware of above. Per Amy patient does have Health Team and she will have to confirm with manager that patient can use her Health Team to go to rehab. Patient is agreeable to SNF search in [facility]. Patient reported that she prefers [facility]. CSW made patient aware that [facility] is not in network with Health Team and SNF list was provided.   FL2 complete and faxed out.   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   Patient/Family's Understanding of and Emotional Response to Diagnosis, Current Treatment, and Prognosis: Patient was pleasant throughout assessment and thanked CSW for visit.   Emotional Assessment Appearance:  Appears stated age Attitude/Demeanor/Rapport:    Affect (typically observed):  Accepting, Adaptable, Pleasant Orientation:  Oriented to Self, Oriented to Place, Oriented to  Time, Oriented to Situation Alcohol / Substance use:  Not Applicable Psych involvement (Current and /or in the community):  No (Comment)  Discharge Needs  Concerns to be addressed:  Discharge  Planning Concerns Readmission within the last 30 days:  No Current discharge risk:  Dependent with Mobility Barriers to Discharge:  Continued Medical Work up   Elwyn Reach 02/06/2015, 4:23 PM

## 2015-02-06 NOTE — Care Management (Signed)
PT is recommending SNF at this time. I have faxed PT note to [place] for review [phone] (fax).

## 2015-02-07 ENCOUNTER — Encounter
Admission: RE | Admit: 2015-02-07 | Discharge: 2015-02-07 | Disposition: A | Discharge status: Home / Self Care | Payer: Self-pay | Source: Ambulatory Visit | Attending: Internal Medicine | Admitting: Internal Medicine

## 2015-02-07 LAB — BASIC METABOLIC PANEL
Anion gap: 5 (ref 5–15)
BUN: 19 mg/dL (ref 6–20)
CALCIUM: 8 mg/dL — AB (ref 8.9–10.3)
CHLORIDE: 106 mmol/L (ref 101–111)
CO2: 25 mmol/L (ref 22–32)
CREATININE: 0.78 mg/dL (ref 0.44–1.00)
GFR calc non Af Amer: >60 mL/min (ref >60)
Glucose, Bld: 129 mg/dL — ABNORMAL HIGH (ref 65–99)
Potassium: 3.9 mmol/L (ref 3.5–5.1)
SODIUM: 136 mmol/L (ref 135–145)

## 2015-02-07 LAB — CBC
HCT: 27.9 % — ABNORMAL LOW (ref 35.0–47.0)
Hemoglobin: 9.4 g/dL — ABNORMAL LOW (ref 12.0–16.0)
MCH: 30.7 pg (ref 26.0–34.0)
MCHC: 33.7 g/dL (ref 32.0–36.0)
MCV: 91.2 fL (ref 80.0–100.0)
PLATELETS: 116 10*3/uL — AB (ref 150–440)
RBC: 3.06 MIL/uL — AB (ref 3.80–5.20)
RDW: 12.8 % (ref 11.5–14.5)
WBC: 10.7 10*3/uL (ref 3.6–11.0)

## 2015-02-07 NOTE — Progress Notes (Signed)
  Subjective: 2 Days Post-Op Procedure(s) (LRB): INTRAMEDULLARY (IM) NAIL INTERTROCHANTRIC (Right) Patient reports pain as mild.   Patient is well, and has had no acute complaints or problems Plan is to go Skilled nursing facility after hospital stay. Negative for chest pain and shortness of breath Fever: no, last HR was 101 Gastrointestinal:Negative for nausea and vomiting  Objective: Vital signs in last 24 hours: Temp:  [98.1 F (36.7 C)-99.4 F (37.4 C)] 99.4 F (37.4 C) (11/10 0448) Pulse Rate:  [86-101] 101 (11/10 0448) Resp:  [18] 18 (11/10 0448) BP: (108-139)/(66-87) 116/73 mmHg (11/10 0448) SpO2:  [90 %-94 %] 94 % (11/10 0448)  Intake/Output from previous day:  Intake/Output Summary (Last 24 hours) at 02/07/15 0743 Last data filed at 02/07/15 0610  Gross per 24 hour  Intake    480 ml  Output    325 ml  Net    155 ml    Intake/Output this shift:    Labs:  Recent Labs  02/04/15 1402 02/05/15 0553 02/06/15 0542 02/07/15 0637  HGB 14.0 12.0 10.7* 9.4*    Recent Labs  02/06/15 0542 02/07/15 0637  WBC 9.8 10.7  RBC 3.53* 3.06*  HCT 32.5* 27.9*  PLT 133* 116*    Recent Labs  02/06/15 0542 02/07/15 0637  NA 137 136  K 4.4 3.9  CL 106 106  CO2 27 25  BUN 15 19  CREATININE 0.80 0.78  GLUCOSE 147* 129*  CALCIUM 8.3* 8.0*    Recent Labs  02/04/15 1402 02/05/15 0553  INR 1.02 1.15     EXAM General - Patient is Alert, Appropriate and Oriented Extremity - Neurologically intact ABD soft Sensation intact distally Intact pulses distally Dorsiflexion/Plantar flexion intact Incision: dressing C/D/I Dressing/Incision - clean, dry, no drainage Motor Function - intact, moving foot and toes well on exam.   Bulky dressing changed today, honeycomb and 4x4 with tegaderm placed.  Past Medical History  Diagnosis Date  . Hypertension     Assessment/Plan: 2 Days Post-Op Procedure(s) (LRB): INTRAMEDULLARY (IM) NAIL INTERTROCHANTRIC  (Right) Active Problems:   Intertrochanteric fracture of right femur (HCC)  Estimated body mass index is 23.13 kg/(m^2) as calculated from the following:   Height as of this encounter: 5\' 5"  (1.651 m).   Weight as of this encounter: 63.05 kg (139 lb). Advance diet Up with therapy D/C IV fluids   Pt is tolerating PO intake, can try to d/c IVF today. Pt urinating well, pt has not had a BM yet.  Suppository and FLEET enema ordered. Hg stable, 9.4.  Platelets 116.  Repeat CBC and BMP tomorrow morning. PT recommending SNF at this point.  Pt able to perform straight leg raise while in bed this morning. Oxycodone and Tramadol for pain. Bulky dressing changed this morning, incision dry with no signs of infection.  Honeycomb and 4x4 dressing applied.  DVT Prophylaxis - Lovenox, Foot Pumps and TED hose Weight-Bearing as tolerated to right leg  J. Cameron Proud, PA-C [facility] 02/07/2015, 7:43 AM

## 2015-02-07 NOTE — Progress Notes (Signed)
[place] [facility] [place] NAME: Tunja Ivins    MR#:  [id number]  DATE OF BIRTH:  10-Mar-1946  SUBJECTIVE:  CHIEF COMPLAINT:   Chief Complaint  Patient presents with  . Hip Pain   - Admitted after a fall and right hip fracture. S/p surgery 11/8 , working with PT , no bowel movements so far  -  REVIEW OF SYSTEMS:  Review of Systems  Constitutional: Negative for fever and chills.  HENT: Negative for ear discharge, ear pain and nosebleeds.   Eyes: Negative for blurred vision and double vision.  Respiratory: Negative for cough, shortness of breath and wheezing.   Cardiovascular: Negative for chest pain and palpitations.  Gastrointestinal: Negative for nausea, vomiting, abdominal pain, diarrhea and constipation.  Genitourinary: Negative for dysuria.  Musculoskeletal: Positive for myalgias and joint pain.       Right hip pain  Neurological: Negative for dizziness, seizures, weakness and headaches.  Psychiatric/Behavioral: Negative for depression.    DRUG ALLERGIES:  No Known Allergies  VITALS:  Blood pressure 122/82, pulse 90, temperature 98.2 F (36.8 C), temperature source Oral, resp. rate 18, height 5\' 5"  (1.651 m), weight 63.05 kg (139 lb), SpO2 90 %.  PHYSICAL EXAMINATION:  Physical Exam  GENERAL:  69 y.o.-year-old patient lying in the bed with no acute distress.  EYES: Pupils equal, round, reactive to light and accommodation. No scleral icterus. Extraocular muscles intact.  HEENT: Head atraumatic, normocephalic. Oropharynx and nasopharynx clear.  NECK:  Supple, no jugular venous distention. No thyroid enlargement, no tenderness.  LUNGS: Normal breath sounds bilaterally, no wheezing, rales,rhonchi or crepitation. No use of accessory muscles of respiration.  CARDIOVASCULAR: S1, S2 normal. No murmurs, rubs, or gallops.  ABDOMEN: Soft, nontender, nondistended. Bowel sounds present. No organomegaly or mass.  EXTREMITIES: Right  hip area with clean dressing . No pedal edema, cyanosis, or clubbing.  NEUROLOGIC: Cranial nerves II through XII are intact. Muscle strength 5/5 in all extremities. Sensation intact. Gait not checked.  PSYCHIATRIC: The patient is alert and oriented x 3.  SKIN: No obvious rash, lesion, or ulcer.    LABORATORY PANEL:   CBC  Recent Labs Lab 02/07/15 0637  WBC 10.7  HGB 9.4*  HCT 27.9*  PLT 116*   ------------------------------------------------------------------------------------------------------------------  Chemistries   Recent Labs Lab 02/07/15 0637  NA 136  K 3.9  CL 106  CO2 25  GLUCOSE 129*  BUN 19  CREATININE 0.78  CALCIUM 8.0*   ------------------------------------------------------------------------------------------------------------------  Cardiac Enzymes No results for input(s): TROPONINI in the last 168 hours. ------------------------------------------------------------------------------------------------------------------  RADIOLOGY:  No results found.  EKG:   Orders placed or performed during the hospital encounter of 02/04/15  . EKG 12-Lead  . EKG 12-Lead    ASSESSMENT AND PLAN:    69 year old female with history of hypertension, osteoporosis and hyperlipidemia admitted after a fall and right hip fracture.  #1 right hip intertrochanteric fracture-orthopedics has been consulted. POD #2  - Postoperative DVT prophylaxis and pain management  - F/U with Physical therapy -recommending skilled nursing care -Hemoglobin at 9.4  #2 hypertension-well controlled blood pressure. -On metoprolol.   #3 hyperlipidemia-on statin  #4 osteoporosis-on Evista. Also will need calcium and vitamin D supplements  #5 DVT prophylaxis-lovenox  Dispo- snf  All the records are reviewed and case discussed with Care Management/Social Workerr. Management plans discussed with the patient, family and they are in agreement.  CODE STATUS: Full code  TOTAL TIME  TAKING CARE OF THIS PATIENT:  37 minutes.   POSSIBLE D/C IN a.m. DAYS, DEPENDING ON CLINICAL CONDITION.   Nicholes Mango M.D on 02/07/2015 at 4:56 PM  Between 7am to 6pm - Pager - [phone]  After 6pm go to [email or web address] - password [email or web address] [facility] - Closed  [facility]  Office  [phone]  CC: Primary care physician; No primary care provider on file.

## 2015-02-07 NOTE — Progress Notes (Signed)
Physical Therapy Treatment Patient Details Name: Stacey [name] MRN: [id number] DOB: [birth year] Today's Date: 02/07/2015    History of Present Illness Patient is a pleasant 69 y/o female that presents after mechanical fall while turning at work [name]). ORIF completed on 02/05/2015 for R femur fx.     PT Comments    Pt requires Mod A for transfers/bed mobility and Min A for ambulation with extensive cueing for all mobility. Pt improving slowly. Pt feels R immobilizer is helping her stability in right lower extremity with stand. Pt fearful/anxious with all mobility. Continue PT to progress strength and all functional mobility and progress toward ambulation.   Follow Up Recommendations  SNF     Equipment Recommendations  Rolling walker with 5" wheels    Recommendations for Other Services OT consult     Precautions / Restrictions Precautions Precautions: Fall Restrictions Weight Bearing Restrictions: Yes RLE Weight Bearing: Weight bearing as tolerated    Mobility  Bed Mobility Overal bed mobility: Needs Assistance Bed Mobility: Sit to Supine     Supine to sit: Mod assist;Max assist;+2 for physical assistance Sit to supine: Mod assist   General bed mobility comments: Requires a lot of cueing and encouragement; assist for LEs  Transfers Overall transfer level: Needs assistance Equipment used: Rolling walker (2 wheeled) Transfers: Sit to/from Stand Sit to Stand: Mod assist (from recliner and bedside commode)         General transfer comment: Requires a lot of cueing for sequence and encouragement with Mod A, slow to rise  Ambulation/Gait Ambulation/Gait assistance: Min guard Ambulation Distance (Feet): 4 Feet Assistive device: Rolling walker (2 wheeled) Gait Pattern/deviations: Step-to pattern;Antalgic;Narrow base of support;Decreased step length - right;Decreased step length - left;Trunk flexed;Decreased weight shift to right Gait velocity:  decreased Gait velocity interpretation: <1.8 ft/sec, indicative of risk for recurrent falls General Gait Details: several small effortful steps recliner to bedside commode then to bed. Requires cueing for steps and maneuvering of rw, but does so with increased time/effort   Stairs            Wheelchair Mobility    Modified Rankin (Stroke Patients Only)       Balance Overall balance assessment: Needs assistance Sitting-balance support: No upper extremity supported Sitting balance-Leahy Scale: Fair Sitting balance - Comments: Patient is able to sit at EOB and in the chair with no deviations.    Standing balance support: Bilateral upper extremity supported Standing balance-Leahy Scale: Poor Standing balance comment: Patient improves balance today with knee immobilzer, though she continues to flex her trunk significantly with standing.                     Cognition Arousal/Alertness: Awake/alert Behavior During Therapy: WFL for tasks assessed/performed Overall Cognitive Status: Within Functional Limits for tasks assessed                      Exercises General Exercises - Lower Extremity Ankle Circles/Pumps: AROM;Both;20 reps;Supine Quad Sets: Strengthening;Both;20 reps;Supine Gluteal Sets: Strengthening;Both;20 reps;Supine Straight Leg Raises: AROM;10 reps;AAROM;Both Other Exercises Other Exercises: Patient transferred on and off of the bedside commode with mod-max A x2 and RW    General Comments General comments (skin integrity, edema, etc.): Blood noted around her IV insertion on L wrist, RN notified.       Pertinent Vitals/Pain Pain Assessment:  (does not quantify, but reports R hip is sore) Pain Location: R hip with weight bearing during this session Pain Descriptors /  Indicators: Aching Pain Intervention(s): Limited activity within patient's tolerance;Repositioned    Home Living                      Prior Function            PT  Goals (current goals can now be found in the care plan section) Acute Rehab PT Goals Patient Stated Goal: To increase her independence.  PT Goal Formulation: With patient Time For Goal Achievement: 02/20/15 Potential to Achieve Goals: Good Progress towards PT goals: Progressing toward goals (slowly)    Frequency  BID    PT Plan Current plan remains appropriate    Co-evaluation             End of Session Equipment Utilized During Treatment: Gait belt Activity Tolerance: Patient tolerated treatment well (weakness and fear limiting) Patient left: in bed;with call bell/phone within reach;with bed alarm set;with SCD's reapplied     Time: [id number] PT Time Calculation (min) (ACUTE ONLY): 24 min  Charges:  $Gait Training: 8-22 mins $Therapeutic Activity: 8-22 mins                    G Codes:      Charlaine Dalton 02/07/2015, 3:13 PM

## 2015-02-07 NOTE — Progress Notes (Signed)
Physical Therapy Treatment Patient Details Name: Stacey [name] MRN: [id number] DOB: 03/31/1945 Today's Date: 02/07/2015    History of Present Illness Patient is a pleasant 69 y/o female that presents after mechanical fall while turning at work [name]). ORIF completed on 02/05/2015 for R femur fx.     PT Comments    Patient demonstrates increased tolerance for weight bearing through RLE during this session with knee immobilizer on. She continues to be limited by pain, particularly with weight bearing though she is able to accept weight for pivoting on RLE during this session. Gait continues to be severely limited secondary to poor mechanics and pain. Patient demonstrates improved sitting balance with no use of UEs during this session.   Follow Up Recommendations  SNF     Equipment Recommendations  Rolling walker with 5" wheels    Recommendations for Other Services OT consult     Precautions / Restrictions Precautions Precautions: Fall Restrictions Weight Bearing Restrictions: Yes RLE Weight Bearing: Weight bearing as tolerated    Mobility  Bed Mobility Overal bed mobility: Needs Assistance;+2 for physical assistance       Supine to sit: Mod assist;Max assist;+2 for physical assistance     General bed mobility comments: Pt continues to require significant assistance for RLE mobility secondary to pain induced weakness. She is able to sit independently once at EOB, however it is too painful for her to provide sufficient effort for transfer.   Transfers Overall transfer level: Needs assistance Equipment used: Rolling walker (2 wheeled) Transfers: Sit to/from Stand Sit to Stand: Min assist;Mod assist;+2 physical assistance         General transfer comment: Patient requires additional cuing for hand placement, and initially maintains trunk flexed until cued to "stand tall". No loss of balance or buckling demonstrated.   Ambulation/Gait Ambulation/Gait  assistance: Mod assist;+2 physical assistance Ambulation Distance (Feet): 4 Feet Assistive device: Rolling walker (2 wheeled) Gait Pattern/deviations: Step-to pattern;Antalgic;Narrow base of support;Decreased step length - right;Decreased step length - left;Trunk flexed;Decreased weight shift to right   Gait velocity interpretation: <1.8 ft/sec, indicative of risk for recurrent falls General Gait Details: Patient was able to tolerate WBing on RLE much better today with knee immobilizer in place. She continues to be hesitant to accept weight on RLE, however she is able to pivot onto bedside commode and chair accepting weight onto RLE.    Stairs            Wheelchair Mobility    Modified Rankin (Stroke Patients Only)       Balance Overall balance assessment: Needs assistance Sitting-balance support: No upper extremity supported Sitting balance-Leahy Scale: Fair Sitting balance - Comments: Patient is able to sit at EOB and in the chair with no deviations.    Standing balance support: Bilateral upper extremity supported Standing balance-Leahy Scale: Poor Standing balance comment: Patient improves balance today with knee immobilzer, though she continues to flex her trunk significantly with standing.                     Cognition Arousal/Alertness: Awake/alert Behavior During Therapy: WFL for tasks assessed/performed Overall Cognitive Status: Within Functional Limits for tasks assessed                      Exercises General Exercises - Lower Extremity Straight Leg Raises: AROM;10 reps;AAROM;Both Other Exercises Other Exercises: Patient transferred on and off of the bedside commode with mod-max A x2 and RW    General  Comments General comments (skin integrity, edema, etc.): Blood noted around her IV insertion on L wrist, RN notified.       Pertinent Vitals/Pain Pain Assessment:  (Patient reports increased pain with ambulation, but tolerates much better with  Knee immobilizer in place) Pain Location: R hip with weight bearing during this session Pain Descriptors / Indicators: Aching Pain Intervention(s): Limited activity within patient's tolerance;Monitored during session;Premedicated before session    Home Living                      Prior Function            PT Goals (current goals can now be found in the care plan section) Acute Rehab PT Goals Patient Stated Goal: To increase her independence.  PT Goal Formulation: With patient Time For Goal Achievement: 02/20/15 Potential to Achieve Goals: Good Progress towards PT goals: Progressing toward goals    Frequency  BID    PT Plan Current plan remains appropriate    Co-evaluation             End of Session Equipment Utilized During Treatment: Gait belt Activity Tolerance: Patient limited by pain Patient left: in chair;with chair alarm set;with call bell/phone within reach     Time: 1131-1155 PT Time Calculation (min) (ACUTE ONLY): 24 min  Charges:  $Gait Training: 8-22 mins $Therapeutic Activity: 8-22 mins                    G Codes:      Kerman Passey, PT, DPT    02/07/2015, 1:34 PM

## 2015-02-07 NOTE — Care Management (Addendum)
Claim number obtained: [id number]. I have faxed patient selection of [facility] for tomorrow to Kelli Churn with [facility] [phone] along with progress notes and the last two PT notes. CSW is speaking with Suszanne Finch [phone] fax [phone] ([place] [id number] [place] [address]) which is her workers' comp agent for SNF authorization. Mr. Eugenio Hoes asked to speak with this RNCM and he obtained names of three bed offers: [facility] (patient selection), [facility], and [facility] to check prices. Billing address placed in patient profile. RNCM or CSW to fax discharge notes to [facility] at above fax number. [facility] has been approved by Suszanne Finch-  auth number is same as claim number.

## 2015-02-07 NOTE — Progress Notes (Addendum)
CSW provided patient with bed offers for SNF.  Patient has declined [facility] and has now selected [facility]    Casimer Lanius. Latanya Presser, MSW Clinical Social Work Department [phone] 11:40 AM

## 2015-02-08 LAB — CBC
HCT: 29.7 % — ABNORMAL LOW (ref 35.0–47.0)
Hemoglobin: 9.9 g/dL — ABNORMAL LOW (ref 12.0–16.0)
MCH: 30.9 pg (ref 26.0–34.0)
MCHC: 33.3 g/dL (ref 32.0–36.0)
MCV: 92.8 fL (ref 80.0–100.0)
Platelets: 134 10*3/uL — ABNORMAL LOW (ref 150–440)
RBC: 3.2 MIL/uL — ABNORMAL LOW (ref 3.80–5.20)
RDW: 12.9 % (ref 11.5–14.5)
WBC: 11.7 10*3/uL — ABNORMAL HIGH (ref 3.6–11.0)

## 2015-02-08 LAB — BASIC METABOLIC PANEL
Anion gap: 5 (ref 5–15)
BUN: 18 mg/dL (ref 6–20)
CO2: 26 mmol/L (ref 22–32)
CREATININE: 0.7 mg/dL (ref 0.44–1.00)
Calcium: 8.3 mg/dL — ABNORMAL LOW (ref 8.9–10.3)
Chloride: 105 mmol/L (ref 101–111)
GFR calc non Af Amer: >60 mL/min (ref >60)
GLUCOSE: 115 mg/dL — AB (ref 65–99)
Potassium: 4 mmol/L (ref 3.5–5.1)
Sodium: 136 mmol/L (ref 135–145)

## 2015-02-08 MED ORDER — ENOXAPARIN SODIUM 40 MG/0.4ML ~~LOC~~ SOLN: 40.0000 mg | SUBCUTANEOUS | Status: DC

## 2015-02-08 MED ORDER — TRAMADOL HCL 50 MG PO TABS: 50.0000 mg | ORAL_TABLET | Freq: Four times a day (QID) | ORAL | Status: DC | PRN

## 2015-02-08 MED ORDER — ACETAMINOPHEN 325 MG PO TABS: 650.0000 mg | ORAL_TABLET | Freq: Four times a day (QID) | ORAL | Status: AC | PRN

## 2015-02-08 MED ORDER — DOCUSATE SODIUM 100 MG PO CAPS: 100.0000 mg | ORAL_CAPSULE | Freq: Two times a day (BID) | ORAL | Status: DC

## 2015-02-08 MED ORDER — MAGNESIUM HYDROXIDE 400 MG/5ML PO SUSP: 30.0000 mL | Freq: Every day | ORAL | Status: DC | PRN

## 2015-02-08 MED ORDER — OXYCODONE HCL 5 MG PO TABS: 5.0000 mg | ORAL_TABLET | ORAL | Status: DC | PRN

## 2015-02-08 NOTE — Progress Notes (Signed)
Clinical Social Worker informed by  Nicholes Mango, MD that patient is medically ready to discharge to SNF, Patient and her daughter Verdis Frederickson are in a agreement with plan.  Call to [facility] to confirm that patient's bed is ready. Provided patient's room number 220-B and number to call for report [phone]. All discharge information faxed to  Facility. Rx's added to discharge packet.   RN will call report and patient will discharge to [facility] via EMS.  Casimer Lanius. Latanya Presser, MSW Clinical Social Work Department [phone] 2:04 PM

## 2015-02-08 NOTE — Progress Notes (Signed)
  Subjective: 3 Days Post-Op Procedure(s) (LRB): INTRAMEDULLARY (IM) NAIL INTERTROCHANTRIC (Right) Patient reports pain as mild.   Patient is well, and has had no acute complaints or problems Plan is to go Skilled nursing facility after hospital stay.  Pt has chosen [facility]. Negative for chest pain and shortness of breath Fever: no, current temp is 98, she has had temp of 99.9 while hospitalized Gastrointestinal:Negative for nausea and vomiting  Objective: Vital signs in last 24 hours: Temp:  [98 F (36.7 C)-99.9 F (37.7 C)] 98 F (36.7 C) (11/11 0741) Pulse Rate:  [86-115] 101 (11/11 0741) Resp:  [18] 18 (11/11 0741) BP: (104-127)/(71-82) 127/81 mmHg (11/11 0741) SpO2:  [90 %-94 %] 94 % (11/11 0741)  Intake/Output from previous day:  Intake/Output Summary (Last 24 hours) at 02/08/15 0750 Last data filed at 02/08/15 0132  Gross per 24 hour  Intake    720 ml  Output    300 ml  Net    420 ml    Intake/Output this shift:    Labs:  Recent Labs  02/06/15 0542 02/07/15 0637 02/08/15 0644  HGB 10.7* 9.4* 9.9*    Recent Labs  02/07/15 0637 02/08/15 0644  WBC 10.7 11.7*  RBC 3.06* 3.20*  HCT 27.9* 29.7*  PLT 116* 134*    Recent Labs  02/07/15 0637 02/08/15 0644  NA 136 136  K 3.9 4.0  CL 106 105  CO2 25 26  BUN 19 18  CREATININE 0.78 0.70  GLUCOSE 129* 115*  CALCIUM 8.0* 8.3*   No results for input(s): LABPT, INR in the last 72 hours.   EXAM General - Patient is Alert, Appropriate and Oriented Extremity - Neurologically intact ABD soft Sensation intact distally Intact pulses distally Dorsiflexion/Plantar flexion intact Incision: scant drainage No cellulitis present Dressing/Incision - blood tinged drainage Motor Function - intact, moving foot and toes well on exam.   Abdomen is soft with normal BS.  Past Medical History  Diagnosis Date  . Hypertension     Assessment/Plan: 3 Days Post-Op Procedure(s) (LRB): INTRAMEDULLARY (IM) NAIL  INTERTROCHANTRIC (Right) Active Problems:   Intertrochanteric fracture of right femur (HCC)  Estimated body mass index is 23.13 kg/(m^2) as calculated from the following:   Height as of this encounter: 5\' 5"  (1.651 m).   Weight as of this encounter: 63.05 kg (139 lb). Up with therapy   Pt's WBC up to 11.7 this AM.  Denies any burning with urination, any shortness of breath.  The wound has slight bloody drainage without any surrounding erythema.  No signs of Infxn. Pt has not had a BM yet.  Suppository given, FLEET enema on board as needed. Pt is stable orthopaedically, will needed medical clearance prior to discharge.  Pt's Staples can be removed in 10-14 days. Pt to follow-up with [facility] with Dr. Roland Rack in 5-6 weeks for re-evaluation and repeat x-rays.  DVT Prophylaxis - Lovenox, Foot Pumps and TED hose Weight-Bearing as tolerated to right leg  J. Cameron Proud, PA-C [facility] 02/08/2015, 7:50 AM

## 2015-02-08 NOTE — Progress Notes (Signed)
Patient being discharged this afternoon. Report called to [place] at [facility]. EMS called. Belongings packed & daughter bedside. VSS at time of d/c.

## 2015-02-08 NOTE — Progress Notes (Signed)
Physical Therapy Treatment Patient Details Name: Stacey [name] MRN: [id number] DOB: May 11, 1945 Today's Date: 02/08/2015    History of Present Illness Patient is a pleasant 69 y/o female that presents after mechanical fall while turning at work [name]). ORIF completed on 02/05/2015 for R femur fx.     PT Comments    Patient continues to be very self limiting via fear and anxiety of movement. She does not respond to pain well and has significant pain related weakness in her LEs. Several spells of near buckling, though no true buckling noted in transfers. She is improving with her tolerance and anxiety today, though ambulation remains extremely limited, slow, and unsafe as she is unable to clear the floor for LE advancement or turning with significant cuing and Knee immobilizer in place. Skilled PT services continue to be indicated to address the above deficits.   Follow Up Recommendations  SNF     Equipment Recommendations  Rolling walker with 5" wheels    Recommendations for Other Services OT consult     Precautions / Restrictions Precautions Precautions: Fall Restrictions Weight Bearing Restrictions: Yes RLE Weight Bearing: Weight bearing as tolerated    Mobility  Bed Mobility Overal bed mobility: Needs Assistance       Supine to sit: Mod assist;+2 for physical assistance     General bed mobility comments: She provides increased effort through her trunk and her LLE, she is still unable to bring her RLE to EOB without assistance.   Transfers Overall transfer level: Needs assistance Equipment used: Rolling walker (2 wheeled) Transfers: Sit to/from Stand Sit to Stand: Mod assist;+2 physical assistance         General transfer comment: Patient educated for hand placement, noted improved effort and timing from bedside commode and bed, though still very anxious about descending to the bed requiring significant cuing for hand placement and allowing her RLE to  slide out.   Ambulation/Gait Ambulation/Gait assistance: Mod assist;+2 physical assistance Ambulation Distance (Feet): 4 Feet Assistive device: Rolling walker (2 wheeled) Gait Pattern/deviations: Step-to pattern;Decreased stance time - right;Decreased step length - right;Decreased step length - left;Antalgic;Trunk flexed Gait velocity: decreased Gait velocity interpretation: <1.8 ft/sec, indicative of risk for recurrent falls General Gait Details: Patient educated to circumduct her RLE, unable to complete she requires assistance with raising her heel on RLE, to allow for turning. She is able to clear the floor ambulating posteriorly, though minimally for several steps prior to fatiguing. Poor hand placement throughout as she continues to be anxious.    Stairs            Wheelchair Mobility    Modified Rankin (Stroke Patients Only)       Balance Overall balance assessment: Needs assistance Sitting-balance support: No upper extremity supported Sitting balance-Leahy Scale: Good Sitting balance - Comments: Patient is able to sit at EOB and in the chair with no deviations.    Standing balance support: Bilateral upper extremity supported Standing balance-Leahy Scale: Poor Standing balance comment: Patient again with poor hand placement, flexed trunk in standing, and poor foot clearance with mobility.                     Cognition Arousal/Alertness: Awake/alert Behavior During Therapy: WFL for tasks assessed/performed Overall Cognitive Status: Within Functional Limits for tasks assessed                      Exercises General Exercises - Lower Extremity Long Arc Quad: AROM;Left;10  reps Straight Leg Raises: AROM;10 reps;AAROM;Both Other Exercises Other Exercises: Patient transferred on and off of the bedside commode with mod-max A x2 and RW    General Comments        Pertinent Vitals/Pain Pain Assessment:  (Patient states she is experiencing less pain  during this session, she received pain medications well prior to this session. She is able to increase her weight bearing on her RLE with KI in place) Pain Location: R hip with WBing, some pain initially in L hip, alleviated with soft tissue mobilization.  Pain Descriptors / Indicators: Aching Pain Intervention(s): Limited activity within patient's tolerance;Monitored during session    Home Living                      Prior Function            PT Goals (current goals can now be found in the care plan section) Acute Rehab PT Goals Patient Stated Goal: To increase her independence.  PT Goal Formulation: With patient Time For Goal Achievement: 02/20/15 Potential to Achieve Goals: Good Progress towards PT goals: Progressing toward goals    Frequency  BID    PT Plan Current plan remains appropriate    Co-evaluation             End of Session Equipment Utilized During Treatment: Gait belt Activity Tolerance: Patient tolerated treatment well (Anxiety and fear limiting) Patient left: in chair;with call bell/phone within reach;with chair alarm set;with SCD's reapplied     Time: [id number] PT Time Calculation (min) (ACUTE ONLY): 31 min  Charges:  $Gait Training: 8-22 mins $Therapeutic Activity: 8-22 mins                    G Codes:      Kerman Passey, PT, DPT    02/08/2015, 12:08 PM

## 2015-02-08 NOTE — Progress Notes (Addendum)
Physical Therapy Treatment Patient Details Name: Stacey [name] MRN: [id number] DOB: 20-Sep-1945 Today's Date: 02/08/2015    History of Present Illness Patient is a pleasant 69 y/o female that presents after mechanical fall while turning at work [name]). ORIF completed on 02/05/2015 for R femur fx.     PT Comments    RN contacted PT for assistance with return transfer from chair to bed. Patient requested to use the [facility] prior to transfer. Patient continues to be very limited by pain/anxiety and is unable to complete pivot transfer with RW in reasonable time. Patient demonstrates need for significant assistance with all mobility at this time and would benefit from STR to increase her functional mobility status. Of note patient did report numbness in hands this morning, though this has resolved throughout the day with no current complaints.   Follow Up Recommendations  SNF     Equipment Recommendations  Rolling walker with 5" wheels    Recommendations for Other Services OT consult     Precautions / Restrictions Precautions Precautions: Fall Restrictions Weight Bearing Restrictions: Yes RLE Weight Bearing: Weight bearing as tolerated    Mobility  Bed Mobility Overal bed mobility: Needs Assistance Bed Mobility: Sit to Supine     Supine to sit: Mod assist;+2 for physical assistance Sit to supine: +2 for physical assistance;Mod assist;Max assist   General bed mobility comments: Patient assisted to supine to prepare for discharge, she is still too weak/painful to attempt bed mobility.   Transfers Overall transfer level: Needs assistance Equipment used: Rolling walker (2 wheeled) Transfers: Sit to/from [facility] Sit to Stand: Mod assist;+2 physical assistance Stand pivot transfers: Mod assist;Max assist;+2 physical assistance       General transfer comment: Patient transferred to bedside commode from chair and from bedside commode back to bed. No  ambulation attempted given patient's weakness/anxiety. Patient is able to marginally rotate, though PT assists via lifting her RLE through the knee immobilizer.   Ambulation/Gait Ambulation/Gait assistance: Mod assist;+2 physical assistance Ambulation Distance (Feet): 4 Feet Assistive device: Rolling walker (2 wheeled) Gait Pattern/deviations: Step-to pattern;Decreased stance time - right;Decreased step length - right;Decreased step length - left;Antalgic;Trunk flexed Gait velocity: decreased Gait velocity interpretation: <1.8 ft/sec, indicative of risk for recurrent falls General Gait Details: Patient educated to circumduct her RLE, unable to complete she requires assistance with raising her heel on RLE, to allow for turning. She is able to clear the floor ambulating posteriorly, though minimally for several steps prior to fatiguing. Poor hand placement throughout as she continues to be anxious.    Stairs            Wheelchair Mobility    Modified Rankin (Stroke Patients Only)       Balance Overall balance assessment: Needs assistance Sitting-balance support: No upper extremity supported Sitting balance-Leahy Scale: Good Sitting balance - Comments: Patient is able to sit at EOB and in the chair with no deviations.    Standing balance support: Bilateral upper extremity supported Standing balance-Leahy Scale: Poor Standing balance comment: Patient again with poor hand placement, flexed trunk in standing, and poor foot clearance with mobility.                     Cognition Arousal/Alertness: Awake/alert Behavior During Therapy: WFL for tasks assessed/performed Overall Cognitive Status: Within Functional Limits for tasks assessed                      Exercises General Exercises -  Lower Extremity Long Arc Quad: AROM;Left;10 reps Straight Leg Raises: AROM;10 reps;AAROM;Both Other Exercises Other Exercises: Patient transferred on and off of the bedside commode  with mod-max A x2 and RW    General Comments        Pertinent Vitals/Pain Pain Assessment:  (Patient continues to have high levels of pain with weight bearing and ambulation) Pain Location: R hip Pain Descriptors / Indicators: Aching Pain Intervention(s): Limited activity within patient's tolerance;Monitored during session    Home Living                      Prior Function            PT Goals (current goals can now be found in the care plan section) Acute Rehab PT Goals Patient Stated Goal: To increase her independence.  PT Goal Formulation: With patient Time For Goal Achievement: 02/20/15 Potential to Achieve Goals: Good Progress towards PT goals: Progressing toward goals    Frequency  BID    PT Plan Current plan remains appropriate    Co-evaluation             End of Session Equipment Utilized During Treatment: Gait belt Activity Tolerance: Patient tolerated treatment well Patient left: in bed;with nursing/sitter in room;with family/visitor present     Time: [id number] PT Time Calculation (min) (ACUTE ONLY): 17 min  Charges:  $Gait Training: 8-22 mins $Therapeutic Activity: 8-22 mins                    G Codes:      Addendum: added signature   Kerman Passey, PT, DPT    02/08/2015, 2:51 PM

## 2015-02-08 NOTE — Clinical Social Work Placement (Signed)
   CLINICAL SOCIAL WORK PLACEMENT  NOTE  Date:  02/08/2015  Patient Details  Name: Stacey [name] MRN: [id number] Date of Birth: [birth year]  Clinical Social Work is seeking post-discharge placement for this patient at the [facility] level of care (*CSW will initial, date and re-position this form in  chart as items are completed):  Yes   Patient/family provided with [place] Work Department's list of facilities offering this level of care within the geographic area requested by the patient (or if unable, by the patient's family).  Yes   Patient/family informed of their freedom to choose among providers that offer the needed level of care, that participate in Medicare, Medicaid or managed care program needed by the patient, have an available bed and are willing to accept the patient.  Yes   Patient/family informed of [place]'s ownership interest in [facility] and [facility], as well as of the fact that they are under no obligation to receive care at these facilities.  PASRR submitted to EDS on 02/06/15     PASRR number received on 02/06/15     Existing PASRR number confirmed on       FL2 transmitted to all facilities in geographic area requested by pt/family on 02/06/15     FL2 transmitted to all facilities within larger geographic area on       Patient informed that his/her managed care company has contracts with or will negotiate with certain facilities, including the following:        Yes   Patient/family informed of bed offers received.  Patient chooses bed at  [facility])     Physician recommends and patient chooses bed at      Patient to be transferred to  [facility]) on 02/08/15.  Patient to be transferred to facility by  (EMS)     Patient family notified on 02/08/15 of transfer.  Name of family member notified:   (Daughter Verdis Frederickson)     PHYSICIAN Please sign FL2     Additional Comment:     _______________________________________________ Maurine Cane, LCSW 02/08/2015, 2:00 PM

## 2015-02-08 NOTE — Discharge Instructions (Signed)
Diet: As you were doing prior to hospitalization   Shower:  May shower but keep the wounds dry, use an occlusive plastic wrap, NO SOAKING IN TUB.  If the bandage gets wet, change with a clean dry gauze.  Dressing:  You may change your dressing as needed. Change the dressing with sterile gauze dressing.    Activity:  Increase activity slowly as tolerated, but follow the weight bearing instructions below.  No lifting or driving for 6 weeks.  Weight Bearing:   Weight bearing as tolerated to right lower extremity  To prevent constipation: you may use a stool softener such as -  Colace (over the counter) 100 mg by mouth twice a day  Drink plenty of fluids (prune juice may be helpful) and high fiber foods Miralax (over the counter) for constipation as needed.    Itching:  If you experience itching with your medications, try taking only a single pain pill, or even half a pain pill at a time.  You may take up to 10 pain pills per day, and you can also use benadryl over the counter for itching or also to help with sleep.   Precautions:  If you experience chest pain or shortness of breath - call 911 immediately for transfer to the hospital emergency department!!  If you develop a fever greater that 101 F, purulent drainage from wound, increased redness or drainage from wound, or calf pain-Call [facility]                                              Follow- Up Appointment:  Please call for an appointment to be seen in 2 weeks at [facility]  Activity as tolerated, as recommended by physical therapy and orthopedics Diet low-salt, low-fat Follow-up with primary care physician at facility in 3-5 days Follow-up with [facility] orthopedics Dr. Leslye Peer in 4-6 weeks, staples can be removed in 10-14 days

## 2015-02-08 NOTE — Discharge Summary (Signed)
[facility] NAME: Stacey [name]    MR#:  [id number]  DATE OF BIRTH:  Feb 20, 1946  DATE OF ADMISSION:  02/04/2015 ADMITTING PHYSICIAN: Nicholes Mango, MD  DATE OF DISCHARGE: No discharge date for patient encounter.  PRIMARY CARE PHYSICIAN: No primary care provider on file.    ADMISSION DIAGNOSIS:  Closed displaced intertrochanteric fracture of right femur, initial encounter ([place]) [S72.141A]  DISCHARGE DIAGNOSIS:  Active Problems:   Intertrochanteric fracture of right femur ([name])   SECONDARY DIAGNOSIS:   Past Medical History  Diagnosis Date  . Hypertension     HOSPITAL COURSE:  69 year old female with history of hypertension, osteoporosis and hyperlipidemia admitted after a fall and right hip fracture.  #1 right hip intertrochanteric fracture-orthopedics has been consulted. POD #3 - Postoperative DVT prophylaxis and pain management  - F/U with Physical therapy -recommending skilled nursing care -Hemoglobin at 9.9 -Had a BM today Patient is to follow-up with orthopedics in 4-6 weeks and staple removal can be done in 10-14 days  #2 hypertension-well controlled blood pressure. -On metoprolol.   #3 hyperlipidemia-on statin  #4 osteoporosis-on Evista. Also will need calcium and vitamin D supplements  #5 DVT prophylaxis-lovenox,, SCDs  Dispo- snf  DISCHARGE CONDITIONS:   fair  CONSULTS OBTAINED:  Treatment Team:  Nicholes Mango, MD Corky Mull, MD   PROCEDURES right hip surgery  DRUG ALLERGIES:  No Known Allergies  DISCHARGE MEDICATIONS:   Current Discharge Medication List    START taking these medications   Details  acetaminophen (TYLENOL) 325 MG tablet Take 2 tablets (650 mg total) by mouth every 6 (six) hours as needed for mild pain (or Fever >/= 101).    docusate sodium (COLACE) 100 MG capsule Take 1 capsule (100 mg total) by mouth 2 (two) times daily. Qty: 10 capsule, Refills: 0     enoxaparin (LOVENOX) 40 MG/0.4ML injection Inject 0.4 mLs (40 mg total) into the skin daily. Qty: 14 Syringe, Refills: 0    magnesium hydroxide (MILK OF MAGNESIA) 400 MG/5ML suspension Take 30 mLs by mouth daily as needed for mild constipation. Qty: 360 mL, Refills: 0    oxyCODONE (OXY IR/ROXICODONE) 5 MG immediate release tablet Take 1-2 tablets (5-10 mg total) by mouth every 4 (four) hours as needed for breakthrough pain. Qty: 60 tablet, Refills: 0    traMADol (ULTRAM) 50 MG tablet Take 1-2 tablets (50-100 mg total) by mouth every 6 (six) hours as needed for severe pain. Qty: 40 tablet, Refills: 0      CONTINUE these medications which have NOT CHANGED   Details  Calcium Citrate-Vitamin D (CALCIUM + D PO) Take 1 tablet by mouth daily.    Cholecalciferol (VITAMIN D-3) 1000 UNITS CAPS Take 1 capsule by mouth daily.    GARLIC PO Take XX123456 mg by mouth daily.     metoprolol (LOPRESSOR) 50 MG tablet Take 50 mg by mouth every morning.    Multiple Vitamin (MULTI-VITAMINS) TABS Take 1 tablet by mouth daily.    pravastatin (PRAVACHOL) 20 MG tablet TAKE 1 TABLET BY MOUTH DAILY Qty: 30 tablet, Refills: 1    raloxifene (EVISTA) 60 MG tablet TAKE 1 TABLET BY MOUTH DAILY Qty: 30 tablet, Refills: 1    Red Yeast Rice Extract (RED YEAST RICE PO) Take 600 mg by mouth daily.     Turmeric Curcumin 500 MG CAPS Take 500 mg by mouth daily.    vitamin C (ASCORBIC ACID) 500 MG tablet Take 1 tablet  by mouth daily.         DISCHARGE INSTRUCTIONS:   Activity as tolerated, as recommended by physical therapy and orthopedics Diet low-salt, low-fat Follow-up with primary care physician at facility in 3-5 days Follow-up with [facility] orthopedics Dr. Leslye Peer in 4-6 weeks, staples can be removed in 10-14 days  DIET:  Low fat, Low cholesterol diet  DISCHARGE CONDITION:  Stable  ACTIVITY:  Activity as tolerated, as per PT recommendations  OXYGEN:  Home Oxygen: No.   Oxygen Delivery: room  air  DISCHARGE LOCATION:  nursing home   If you experience worsening of your admission symptoms, develop shortness of breath, life threatening emergency, suicidal or homicidal thoughts you must seek medical attention immediately by calling 911 or calling your MD immediately  if symptoms less severe.  You Must read complete instructions/literature along with all the possible adverse reactions/side effects for all the Medicines you take and that have been prescribed to you. Take any new Medicines after you have completely understood and accpet all the possible adverse reactions/side effects.   Please note  You were cared for by a hospitalist during your hospital stay. If you have any questions about your discharge medications or the care you received while you were in the hospital after you are discharged, you can call the unit and asked to speak with the hospitalist on call if the hospitalist that took care of you is not available. Once you are discharged, your primary care physician will handle any further medical issues. Please note that NO REFILLS for any discharge medications will be authorized once you are discharged, as it is imperative that you return to your primary care physician (or establish a relationship with a primary care physician if you do not have one) for your aftercare needs so that they can reassess your need for medications and monitor your lab values.     Today  Chief Complaint  Patient presents with  . Hip Pain   Patient is resting comfortably. He pain is manageable with the current pain medications, had a bowel movement today   ROS:  CONSTITUTIONAL: Denies fevers, chills. Denies any fatigue, weakness.  EYES: Denies blurry vision, double vision, eye pain. EARS, NOSE, THROAT: Denies tinnitus, ear pain, hearing loss. RESPIRATORY: Denies cough, wheeze, shortness of breath.  CARDIOVASCULAR: Denies chest pain, palpitations, edema.  GASTROINTESTINAL: Denies nausea,  vomiting, diarrhea, abdominal pain. Denies bright red blood per rectum. GENITOURINARY: Denies dysuria, hematuria. ENDOCRINE: Denies nocturia or thyroid problems. HEMATOLOGIC AND LYMPHATIC: Denies easy bruising or bleeding. SKIN: Denies rash or lesion. MUSCULOSKELETAL: Right hip pain from surgery. Denies pain in neck, back, shoulder, knees, symptoms.  NEUROLOGIC: Denies paralysis, paresthesias.  PSYCHIATRIC: Denies anxiety or depressive symptoms.   VITAL SIGNS:  Blood pressure 127/81, pulse 101, temperature 98 F (36.7 C), temperature source Oral, resp. rate 18, height 5\' 5"  (1.651 m), weight 63.05 kg (139 lb), SpO2 94 %.  I/O:   Intake/Output Summary (Last 24 hours) at 02/08/15 1305 Last data filed at 02/08/15 0900  Gross per 24 hour  Intake    720 ml  Output      0 ml  Net    720 ml    PHYSICAL EXAMINATION:  GENERAL:  69 y.o.-year-old patient lying in the bed with no acute distress.  EYES: Pupils equal, round, reactive to light and accommodation. No scleral icterus. Extraocular muscles intact.  HEENT: Head atraumatic, normocephalic. Oropharynx and nasopharynx clear.  NECK:  Supple, no jugular venous distention. No thyroid enlargement, no  tenderness.  LUNGS: Normal breath sounds bilaterally, no wheezing, rales,rhonchi or crepitation. No use of accessory muscles of respiration.  CARDIOVASCULAR: S1, S2 normal. No murmurs, rubs, or gallops.  ABDOMEN: Soft, non-tender, non-distended. Bowel sounds present. No organomegaly or mass.  EXTREMITIES: Right hip is in clear bandage. No pedal edema, cyanosis, or clubbing.  NEUROLOGIC: Cranial nerves II through XII are intact. Muscle strength 5/5 in all extremities except right lower extremity. Sensation intact. Gait not checked.  PSYCHIATRIC: The patient is alert and oriented x 3.  SKIN: No obvious rash, lesion, or ulcer.   DATA REVIEW:   CBC  Recent Labs Lab 02/08/15 0644  WBC 11.7*  HGB 9.9*  HCT 29.7*  PLT 134*    Chemistries    Recent Labs Lab 02/08/15 0644  NA 136  K 4.0  CL 105  CO2 26  GLUCOSE 115*  BUN 18  CREATININE 0.70  CALCIUM 8.3*    Cardiac Enzymes No results for input(s): TROPONINI in the last 168 hours.  Microbiology Results  Results for orders placed or performed during the hospital encounter of 02/04/15  Surgical pcr screen     Status: None   Collection Time: 02/04/15  6:00 PM  Result Value Ref Range Status   MRSA, PCR NEGATIVE NEGATIVE Final   Staphylococcus aureus NEGATIVE NEGATIVE Final    Comment:        The Xpert SA Assay (FDA approved for NASAL specimens in patients over 56 years of age), is one component of a comprehensive surveillance program.  Test performance has been validated by [facility] for patients greater than or equal to 66 year old. It is not intended to diagnose infection nor to guide or monitor treatment.     RADIOLOGY:  Dg Chest Portable 1 View  02/04/2015  CLINICAL DATA:  69 year old female preoperative study. Hypertension. Initial encounter. EXAM: PORTABLE CHEST 1 VIEW COMPARISON:  None. FINDINGS: Portable AP supine view at 1502 hours. Cardiomegaly. Mildly tortuous thoracic aorta. Other mediastinal contours are within normal limits. Visualized tracheal air column is within normal limits. Allowing for portable technique, the lungs are clear. No pneumothorax or pleural effusion evident on this supine view. IMPRESSION: Cardiomegaly.  No acute cardiopulmonary abnormality. Electronically Signed   By: Genevie Ann M.D.   On: 02/04/2015 15:16   Dg Hip Operative Unilat With Pelvis Right  02/05/2015  CLINICAL DATA:  IM NAIL right femur EXAM: OPERATIVE RIGHT HIP (WITH PELVIS IF PERFORMED) 4 VIEWS TECHNIQUE: Fluoroscopic spot image(s) were submitted for interpretation post-operatively. COMPARISON:  02/04/2015 FINDINGS: The patient has undergone lag screw/intra medullary rod fixation of intertrochanteric fracture. Proximal and distal aspects appear unremarkable. No  evidence for dislocation on the views provided. IMPRESSION: Status post ORIF right intertrochanteric fracture. Electronically Signed   By: Nolon Nations M.D.   On: 02/05/2015 15:55   Dg Hip Unilat With Pelvis 2-3 Views Right  02/04/2015  CLINICAL DATA:  Pain following fall EXAM: DG HIP (WITH OR WITHOUT PELVIS) 2-3V RIGHT COMPARISON:  None. FINDINGS: Frontal pelvis as well as frontal and lateral right hip images were obtained. There is a comminuted intertrochanteric femur fracture on the right with varus angulation at the fracture site. There is no other demonstrable fracture. No dislocation. There is mild narrowing of both hip joints. Bones are diffusely osteoporotic. There are foci of arterial vascular calcification bilaterally. IMPRESSION: Comminuted intertrochanteric femur fracture on the right with varus angulation at the fracture site. No other fractures. No dislocation. Bones are diffusely osteoporotic. Electronically Signed  By: Lowella Grip III M.D.   On: 02/04/2015 14:58    EKG:   Orders placed or performed during the hospital encounter of 02/04/15  . EKG 12-Lead  . EKG 12-Lead      Management plans discussed with the patient, family and they are in agreement.  CODE STATUS:     Code Status Orders        Start     Ordered   02/05/15 1701  Full code   Continuous     02/05/15 1700    Advance Directive Documentation        Most Recent Value   Type of Advance Directive  Living will   Pre-existing out of facility DNR order (yellow form or pink MOST form)     "MOST" Form in Place?        TOTAL TIME TAKING CARE OF THIS PATIENT: 45 minutes.    @[facility] @  on 02/08/2015 at 1:05 PM  Between 7am to 6pm - Pager - [phone]  After 6pm go to [email or web address] - password [email or web address] [facility]  [facility]  Office  [phone]  CC: Primary care physician; No primary care provider on file.

## 2015-02-27 ENCOUNTER — Other Ambulatory Visit: Payer: Self-pay | Admitting: Family Medicine

## 2015-02-28 ENCOUNTER — Encounter
Admission: RE | Admit: 2015-02-28 | Discharge: 2015-02-28 | Disposition: A | Discharge status: Home / Self Care | Payer: PPO | Source: Ambulatory Visit | Attending: Internal Medicine | Admitting: Internal Medicine

## 2015-02-28 ENCOUNTER — Encounter: Admission: RE | Admit: 2015-02-28 | Payer: PPO | Source: Ambulatory Visit | Admitting: Internal Medicine

## 2015-02-28 NOTE — Telephone Encounter (Signed)
Attempted to contact patient. Number in chart is invalid.

## 2015-02-28 NOTE — Telephone Encounter (Signed)
Will give a month supply of medications. Remind her she is due for a follow up appointment.

## 2015-03-08 DIAGNOSIS — M75122 Complete rotator cuff tear or rupture of left shoulder, not specified as traumatic: Secondary | ICD-10-CM | POA: Insufficient documentation

## 2015-03-08 DIAGNOSIS — M7581 Other shoulder lesions, right shoulder: Secondary | ICD-10-CM | POA: Insufficient documentation

## 2015-07-10 ENCOUNTER — Other Ambulatory Visit: Payer: Self-pay | Admitting: Family Medicine

## 2015-09-12 ENCOUNTER — Ambulatory Visit (INDEPENDENT_AMBULATORY_CARE_PROVIDER_SITE_OTHER): Payer: PPO | Admitting: Family Medicine

## 2015-09-12 ENCOUNTER — Encounter: Payer: Self-pay | Admitting: Family Medicine

## 2015-09-12 VITALS — BP 132/78 | HR 58 | Temp 98.3°F | Resp 16 | Wt 137.0 lb

## 2015-09-12 DIAGNOSIS — M810 Age-related osteoporosis without current pathological fracture: Secondary | ICD-10-CM | POA: Diagnosis not present

## 2015-09-12 DIAGNOSIS — I10 Essential (primary) hypertension: Secondary | ICD-10-CM

## 2015-09-12 DIAGNOSIS — E785 Hyperlipidemia, unspecified: Secondary | ICD-10-CM | POA: Diagnosis not present

## 2015-09-12 DIAGNOSIS — M7022 Olecranon bursitis, left elbow: Secondary | ICD-10-CM | POA: Diagnosis not present

## 2015-09-12 NOTE — Progress Notes (Signed)
Patient ID: Stacey [name], female   DOB: Feb 25, 1946, 70 y.o.   MRN: [id number]       Patient: Stacey [name] Female    DOB: 24-Oct-1945   70 y.o.   MRN: [id number] Visit Date: 09/12/2015  Today's Provider: Vernie Murders, PA   Chief Complaint  Patient presents with  . Joint Swelling    Patient has a "knot" on her left elbow.    Subjective:    HPI Patient c/o a "knot" on her left elbow that she has noticed over the last 2 weeks. Patient denies any injury, but she does report that it is tender. Patient reports that it has not changed in size. She denies any redness. She has been taking Tylenol occasionally for the tenderness.    Patient Active Problem List   Diagnosis Date Noted  . Intertrochanteric fracture of right femur ([place]) 02/04/2015   Past Surgical History  Procedure Laterality Date  . Abdominal hysterectomy    . Intramedullary (im) nail intertrochanteric Right 02/05/2015    Procedure: INTRAMEDULLARY (IM) NAIL INTERTROCHANTRIC;  Surgeon: Corky Mull, MD;  Location: [facility] ORS;  Service: Orthopedics;  Laterality: Right;   No family history on file.  No Known Allergies Current Meds  Medication Sig  . acetaminophen (TYLENOL) 325 MG tablet Take 2 tablets (650 mg total) by mouth every 6 (six) hours as needed for mild pain (or Fever >/= 101).  . Calcium Citrate-Vitamin D (CALCIUM + D PO) Take 1 tablet by mouth daily.  . Cholecalciferol (VITAMIN D-3) 1000 UNITS CAPS Take 1 capsule by mouth daily.  Marland Kitchen docusate sodium (COLACE) 100 MG capsule Take 1 capsule (100 mg total) by mouth 2 (two) times daily.  Marland Kitchen GARLIC PO Take [id number] mg by mouth daily.   . Red Yeast Rice Extract (RED YEAST RICE PO) Take 600 mg by mouth daily.   . Turmeric Curcumin 500 MG CAPS Take 500 mg by mouth daily.  . vitamin C (ASCORBIC ACID) 500 MG tablet Take 1 tablet by mouth daily.    Review of Systems  Constitutional: Negative.   Musculoskeletal: Positive for joint swelling and arthralgias. Negative for  myalgias, back pain, gait problem, neck pain and neck stiffness.  Skin: Negative for color change, pallor, rash and wound.    Social History  Substance Use Topics  . Smoking status: Never Smoker   . Smokeless tobacco: Not on file  . Alcohol Use: No   Objective:   BP 132/78 mmHg  Pulse 58  Temp(Src) 98.3 F (36.8 C)  Resp 16  Wt 137 lb (62.143 kg)  Physical Exam  Constitutional: She is oriented to person, place, and time. She appears well-developed and well-nourished. No distress.  HENT:  Head: Normocephalic and atraumatic.  Right Ear: Hearing normal.  Left Ear: Hearing normal.  Nose: Nose normal.  Eyes: Conjunctivae and lids are normal. Right eye exhibits no discharge. Left eye exhibits no discharge. No scleral icterus.  Neck: Neck supple.  Cardiovascular: Normal rate and regular rhythm.   Pulmonary/Chest: Effort normal and breath sounds normal. No respiratory distress.  Abdominal: Soft. Bowel sounds are normal.  Musculoskeletal:  Well healed scars from right hip repair after fracture last fall. Large fluid filled bursa over the left elbow. Minimal discomfort and full range of motion of the elbows.  Neurological: She is alert and oriented to person, place, and time.  Skin: Skin is intact. No lesion and no rash noted.  Psychiatric: She has a normal mood and affect. Her speech  is normal and behavior is normal. Thought content normal.      Assessment & Plan:     1. Olecranon bursitis, left Onset over the past 2 weeks without known injury. Bursa is fluid filled and aspirated 8 ml of clear serous fluid. Bursa is totally deflated and instilled 1/2 cc of Depomedrol 40 mg. Bandaged with Neosporin ointment, Telfa and Coban. Check CBC for signs of infection. May use NSAID of choice and remove bandage in 3 days. Recheck if any discomfort, fever or recurrence. - CBC with Differential/Platelet  2. Hyperlipidemia Trying to follow low fat diet. Was on Pravastatin in the past year but  using nutritional supplements now, only. Will check labs and follow up pending reports. - Comprehensive metabolic panel - Lipid panel - TSH  3. Osteoporosis BMD on 05-17-14 confirmed osteoporosis. Was on Evista 60 mg qd last year but no longer taking it now. Fractured the right hip requiring surgical pinning 02-04-15. Will recheck calcium and advise to continue Calcium with Vitamin-D supplements daily. - Comprehensive metabolic panel  4. Essential hypertension Stable and well controlled. Been off the Metoprolol since hospitalization for right hip fracture. Recheck pending lab reports.       Vernie Murders, PA  [facility]

## 2015-09-13 LAB — COMPREHENSIVE METABOLIC PANEL
ALK PHOS: 99 IU/L (ref 39–117)
ALT: 9 IU/L (ref 0–32)
AST: 16 IU/L (ref 0–40)
Albumin/Globulin Ratio: 2.2 (ref 1.2–2.2)
Albumin: 4.3 g/dL (ref 3.5–4.8)
BILIRUBIN TOTAL: 0.4 mg/dL (ref 0.0–1.2)
BUN/Creatinine Ratio: 24 (ref 12–28)
BUN: 20 mg/dL (ref 8–27)
CHLORIDE: 104 mmol/L (ref 96–106)
CO2: 26 mmol/L (ref 18–29)
CREATININE: 0.85 mg/dL (ref 0.57–1.00)
Calcium: 10.3 mg/dL (ref 8.7–10.3)
GFR calc Af Amer: 80 mL/min/{1.73_m2} (ref >59)
GFR calc non Af Amer: 70 mL/min/{1.73_m2} (ref >59)
GLUCOSE: 88 mg/dL (ref 65–99)
Globulin, Total: 2 g/dL (ref 1.5–4.5)
Potassium: 4.2 mmol/L (ref 3.5–5.2)
Sodium: 143 mmol/L (ref 134–144)
Total Protein: 6.3 g/dL (ref 6.0–8.5)

## 2015-09-13 LAB — TSH: TSH: 2.88 u[IU]/mL (ref 0.450–4.500)

## 2015-09-13 LAB — CBC WITH DIFFERENTIAL/PLATELET
BASOS ABS: 0 10*3/uL (ref 0.0–0.2)
Basos: 0 %
EOS (ABSOLUTE): 0.2 10*3/uL (ref 0.0–0.4)
Eos: 2 %
Hematocrit: 43.4 % (ref 34.0–46.6)
Hemoglobin: 14.5 g/dL (ref 11.1–15.9)
Immature Grans (Abs): 0 10*3/uL (ref 0.0–0.1)
Immature Granulocytes: 0 %
LYMPHS ABS: 4.4 10*3/uL — AB (ref 0.7–3.1)
Lymphs: 51 %
MCH: 29.8 pg (ref 26.6–33.0)
MCHC: 33.4 g/dL (ref 31.5–35.7)
MCV: 89 fL (ref 79–97)
MONOCYTES: 4 %
MONOS ABS: 0.4 10*3/uL (ref 0.1–0.9)
Neutrophils Absolute: 3.8 10*3/uL (ref 1.4–7.0)
Neutrophils: 43 %
PLATELETS: 184 10*3/uL (ref 150–379)
RBC: 4.87 x10E6/uL (ref 3.77–5.28)
RDW: 13.5 % (ref 12.3–15.4)
WBC: 8.8 10*3/uL (ref 3.4–10.8)

## 2015-09-13 LAB — LIPID PANEL
CHOLESTEROL TOTAL: 211 mg/dL — AB (ref 100–199)
Chol/HDL Ratio: 3.2 ratio units (ref 0.0–4.4)
HDL: 65 mg/dL (ref >39)
LDL CALC: 129 mg/dL — AB (ref 0–99)
TRIGLYCERIDES: 86 mg/dL (ref 0–149)
VLDL CHOLESTEROL CAL: 17 mg/dL (ref 5–40)

## 2015-09-14 ENCOUNTER — Other Ambulatory Visit: Payer: Self-pay | Admitting: Family Medicine

## 2015-09-16 ENCOUNTER — Telehealth: Payer: Self-pay

## 2015-09-16 MED ORDER — PRAVASTATIN SODIUM 20 MG PO TABS: 20.0000 mg | ORAL_TABLET | Freq: Every day | ORAL | Status: DC

## 2015-09-16 NOTE — Telephone Encounter (Signed)
-----   Message from Margo Common, [place] sent at 09/13/2015  4:11 PM EDT ----- No further signs of anemia. Total cholesterol and LDL cholesterol high. Need to be on the Pravastatin 20 mg qd #30 & 4RF regularly. Schedule follow up and recheck labs in 4 months.

## 2015-09-16 NOTE — Telephone Encounter (Signed)
Advised patient of results. Patient is also requesting refill on blood pressure medication. Ok to send? Please advise. Thanks!

## 2015-09-16 NOTE — Telephone Encounter (Signed)
Chart indicates the [facility] received the Metoprolol refill prescription on 09-14-15 at 12:29pm. Has the patient checked with the pharmacy?

## 2015-09-16 NOTE — Telephone Encounter (Signed)
Will advise patient to check with the pharmacy. Have sent in pravastatin into patient's pharmacy as well.

## 2016-02-23 ENCOUNTER — Other Ambulatory Visit: Payer: Self-pay | Admitting: Family Medicine

## 2016-03-03 DIAGNOSIS — E785 Hyperlipidemia, unspecified: Secondary | ICD-10-CM | POA: Insufficient documentation

## 2016-03-03 DIAGNOSIS — I10 Essential (primary) hypertension: Secondary | ICD-10-CM | POA: Insufficient documentation

## 2016-03-03 DIAGNOSIS — M7020 Olecranon bursitis, unspecified elbow: Secondary | ICD-10-CM | POA: Insufficient documentation

## 2016-03-03 DIAGNOSIS — M810 Age-related osteoporosis without current pathological fracture: Secondary | ICD-10-CM | POA: Insufficient documentation

## 2016-03-05 ENCOUNTER — Ambulatory Visit (INDEPENDENT_AMBULATORY_CARE_PROVIDER_SITE_OTHER): Payer: PPO | Admitting: Family Medicine

## 2016-03-05 ENCOUNTER — Encounter: Payer: Self-pay | Admitting: Family Medicine

## 2016-03-05 VITALS — BP 118/78 | HR 68 | Temp 97.9°F | Resp 14 | Wt 146.8 lb

## 2016-03-05 DIAGNOSIS — Z1239 Encounter for other screening for malignant neoplasm of breast: Secondary | ICD-10-CM

## 2016-03-05 DIAGNOSIS — H6121 Impacted cerumen, right ear: Secondary | ICD-10-CM | POA: Diagnosis not present

## 2016-03-05 DIAGNOSIS — Z1231 Encounter for screening mammogram for malignant neoplasm of breast: Secondary | ICD-10-CM

## 2016-03-05 DIAGNOSIS — E782 Mixed hyperlipidemia: Secondary | ICD-10-CM

## 2016-03-05 DIAGNOSIS — I10 Essential (primary) hypertension: Secondary | ICD-10-CM | POA: Diagnosis not present

## 2016-03-05 MED ORDER — NEOMYCIN-POLYMYXIN-HC 1 % OT SOLN: 3.0000 [drp] | Freq: Three times a day (TID) | OTIC | 0 refills | Status: DC

## 2016-03-05 NOTE — Progress Notes (Signed)
Patient: Stacey [name] Female    DOB: [birth year]   70 y.o.   MRN: [id number] Visit Date: 03/05/2016  Today's Provider: Vernie Murders, PA   Chief Complaint  Patient presents with  . Hyperlipidemia  . Hypertension  . Follow-up   Subjective:    HPI  Hypertension, follow-up:  BP Readings from Last 3 Encounters:  03/05/16 118/78  09/12/15 132/78  02/08/15 122/84    She was last seen for hypertension 6 months ago.  BP at that visit was 132/78. Management since that visit includes continue Metoprolol.She reports good compliance with treatment. She is not having side effects.  She is exercising. She is adherent to low salt diet.   Outside blood pressures are not being checked. She is experiencing none.  Patient denies chest pain, irregular heart beat and palpitations.   Cardiovascular risk factors include advanced age (older than 64 for men, 78 for women), dyslipidemia and hypertension.  Use of agents associated with hypertension: none.   ------------------------------------------------------------------------    Lipid/Cholesterol, Follow-up:   Last seen for this 6 months ago.  Management since that visit includes recommended to restart Pravastatin on 6/19 and low fat diet and continue using nutritional supplements.  Last Lipid Panel:    Component Value Date/Time   CHOL 211 (H) 09/12/2015 1000   TRIG 86 09/12/2015 1000   HDL 65 09/12/2015 1000   CHOLHDL 3.2 09/12/2015 1000   LDLCALC 129 (H) 09/12/2015 1000    She reports good compliance with treatment. She is not having side effects.   Wt Readings from Last 3 Encounters:  03/05/16 146 lb 12.8 oz (66.6 kg)  09/12/15 137 lb (62.1 kg)  02/04/15 139 lb (63 kg)    ------------------------------------------------------------------------ Patient Active Problem List   Diagnosis Date Noted  . Hypertension   . Osteoporosis   . Olecranon bursitis   . Hyperlipidemia   . Other shoulder lesions, right shoulder  03/08/2015  . Complete rotator cuff rupture of left shoulder 03/08/2015  . Intertrochanteric fracture of right femur ([place]) 02/04/2015   Past Surgical History:  Procedure Laterality Date  . ABDOMINAL HYSTERECTOMY    . INTRAMEDULLARY (IM) NAIL INTERTROCHANTERIC Right 02/05/2015   Procedure: INTRAMEDULLARY (IM) NAIL INTERTROCHANTRIC;  Surgeon: Corky Mull, MD;  Location: [facility] ORS;  Service: Orthopedics;  Laterality: Right;   No family history on file.   No Known Allergies   Previous Medications   ACETAMINOPHEN (TYLENOL) 325 MG TABLET    Take 2 tablets (650 mg total) by mouth every 6 (six) hours as needed for mild pain (or Fever >/= 101).   CALCIUM CITRATE-VITAMIN D (CALCIUM + D PO)    Take 1 tablet by mouth daily.   CHOLECALCIFEROL (VITAMIN D-3) 1000 UNITS CAPS    Take 1 capsule by mouth daily.   GARLIC PO    Take [id number] mg by mouth daily.    METOPROLOL SUCCINATE (TOPROL-XL) 100 MG 24 HR TABLET    TAKE 1/2 TABLET  ORAL, DAILY BY MOUTH   MULTIPLE VITAMIN (MULTI-VITAMINS) TABS    Take 1 tablet by mouth daily. Reported on 09/12/2015   PRAVASTATIN (PRAVACHOL) 20 MG TABLET    TAKE 1 TABLET (20 MG TOTAL) BY MOUTH DAILY.   RED YEAST RICE EXTRACT (RED YEAST RICE PO)    Take 600 mg by mouth daily.    TURMERIC CURCUMIN 500 MG CAPS    Take 500 mg by mouth daily.   VITAMIN C (ASCORBIC ACID) 500 MG TABLET    Take 1  tablet by mouth daily.    Review of Systems  Constitutional: Negative.   Respiratory: Negative.   Cardiovascular: Negative.   Musculoskeletal: Negative.   Skin:       Dry skin, itchy     Social History  Substance Use Topics  . Smoking status: Never Smoker  . Smokeless tobacco: Not on file  . Alcohol use No   Objective:   BP 118/78 (BP Location: Right Arm, Patient Position: Sitting, Cuff Size: Normal)   Pulse 68   Temp 97.9 F (36.6 C) (Oral)   Resp 14   Wt 146 lb 12.8 oz (66.6 kg)   BMI 24.43 kg/m   Physical Exam  Constitutional: She is oriented to person, place, and  time. She appears well-developed and well-nourished. No distress.  HENT:  Head: Normocephalic and atraumatic.  Right Ear: Hearing normal.  Left Ear: Hearing normal.  Nose: Nose normal.  Hearing diminished since she was a "little girl". Cerumen impaction of the right canal. Has scheduled hearing aids with audiologist.  Eyes: Conjunctivae and lids are normal. Right eye exhibits no discharge. Left eye exhibits no discharge. No scleral icterus.  Cardiovascular: Normal rate and regular rhythm.   Pulmonary/Chest: Effort normal and breath sounds normal. No respiratory distress.  Abdominal: Soft. Bowel sounds are normal.  Musculoskeletal:  Non-tender intermittent enlargement of the radiocarpal joints (L>R).   Neurological: She is alert and oriented to person, place, and time.  Skin: Skin is intact. No lesion and no rash noted.  Psychiatric: She has a normal mood and affect. Her speech is normal and behavior is normal. Thought content normal.      Assessment & Plan:     1. Mixed hyperlipidemia Tolerating Pravastatin 20 mg qd with Red Yeast Rice qd. No myalgias. Recheck labs and continue low fat diet with walking exercise. Recheck pending reports. Need to schedule AWE after January 2018. - Comprehensive metabolic panel - Lipid panel - TSH  2. Essential hypertension Stable and tolerating Toprol-XL 100 mg QD without hypotensive episodes, dizziness or palpitations. Recheck labs and continue present regimen. - CBC with Differential/Platelet - Comprehensive metabolic panel - TSH  3. Impacted cerumen of right ear Significant hearing loss in the right ear. Unable to completely irrigate the cerumen out. Will give ear drops and recheck in 5 days.  4. Breast cancer screening - MM Digital Screening

## 2016-03-06 LAB — COMPREHENSIVE METABOLIC PANEL
A/G RATIO: 2.1 (ref 1.2–2.2)
ALT: 13 IU/L (ref 0–32)
AST: 18 IU/L (ref 0–40)
Albumin: 4.5 g/dL (ref 3.5–4.8)
Alkaline Phosphatase: 89 IU/L (ref 39–117)
BILIRUBIN TOTAL: 0.5 mg/dL (ref 0.0–1.2)
BUN/Creatinine Ratio: 20 (ref 12–28)
BUN: 18 mg/dL (ref 8–27)
CALCIUM: 10.5 mg/dL — AB (ref 8.7–10.3)
CHLORIDE: 102 mmol/L (ref 96–106)
CO2: 26 mmol/L (ref 18–29)
Creatinine, Ser: 0.88 mg/dL (ref 0.57–1.00)
GFR calc Af Amer: 76 mL/min/{1.73_m2} (ref >59)
GFR, EST NON AFRICAN AMERICAN: 66 mL/min/{1.73_m2} (ref >59)
GLOBULIN, TOTAL: 2.1 g/dL (ref 1.5–4.5)
Glucose: 85 mg/dL (ref 65–99)
POTASSIUM: 4 mmol/L (ref 3.5–5.2)
SODIUM: 143 mmol/L (ref 134–144)
Total Protein: 6.6 g/dL (ref 6.0–8.5)

## 2016-03-06 LAB — CBC WITH DIFFERENTIAL/PLATELET
BASOS: 0 %
Basophils Absolute: 0 10*3/uL (ref 0.0–0.2)
EOS (ABSOLUTE): 0.2 10*3/uL (ref 0.0–0.4)
EOS: 2 %
HEMATOCRIT: 43.6 % (ref 34.0–46.6)
Hemoglobin: 14.8 g/dL (ref 11.1–15.9)
Immature Grans (Abs): 0 10*3/uL (ref 0.0–0.1)
Immature Granulocytes: 0 %
LYMPHS ABS: 5 10*3/uL — AB (ref 0.7–3.1)
Lymphs: 52 %
MCH: 30.1 pg (ref 26.6–33.0)
MCHC: 33.9 g/dL (ref 31.5–35.7)
MCV: 89 fL (ref 79–97)
MONOS ABS: 0.4 10*3/uL (ref 0.1–0.9)
Monocytes: 5 %
NEUTROS ABS: 4 10*3/uL (ref 1.4–7.0)
NEUTROS PCT: 41 %
PLATELETS: 202 10*3/uL (ref 150–379)
RBC: 4.91 x10E6/uL (ref 3.77–5.28)
RDW: 13.7 % (ref 12.3–15.4)
WBC: 9.6 10*3/uL (ref 3.4–10.8)

## 2016-03-06 LAB — LIPID PANEL
CHOL/HDL RATIO: 2.9 ratio (ref 0.0–4.4)
Cholesterol, Total: 209 mg/dL — ABNORMAL HIGH (ref 100–199)
HDL: 73 mg/dL (ref >39)
LDL Calculated: 117 mg/dL — ABNORMAL HIGH (ref 0–99)
TRIGLYCERIDES: 95 mg/dL (ref 0–149)
VLDL Cholesterol Cal: 19 mg/dL (ref 5–40)

## 2016-03-06 LAB — TSH: TSH: 3.6 u[IU]/mL (ref 0.450–4.500)

## 2016-03-10 ENCOUNTER — Encounter: Payer: Self-pay | Admitting: Family Medicine

## 2016-03-10 ENCOUNTER — Ambulatory Visit (INDEPENDENT_AMBULATORY_CARE_PROVIDER_SITE_OTHER): Payer: PPO | Admitting: Family Medicine

## 2016-03-10 VITALS — BP 124/82 | HR 75 | Temp 98.0°F | Resp 16 | Wt 147.0 lb

## 2016-03-10 DIAGNOSIS — H7191 Unspecified cholesteatoma, right ear: Secondary | ICD-10-CM

## 2016-03-10 DIAGNOSIS — H9011 Conductive hearing loss, unilateral, right ear, with unrestricted hearing on the contralateral side: Secondary | ICD-10-CM

## 2016-03-10 NOTE — Progress Notes (Signed)
Patient: Stacey [name] Female    DOB: 06/23/1945   70 y.o.   MRN: [id number] Visit Date: 03/10/2016  Today's Provider: Vernie Murders, PA   Chief Complaint  Patient presents with  . Follow-up   Subjective:    HPI Patient is here for a one week follow up to repeat right ear irrigation. She was prescribed NEOMYCIN-POLYMYXIN-HYDROCORTISONE 1 % SOLN otic solution on 03/05/2016. Patient did not picked up RX until 03/07/2016. Patient reports good compliance with treatment plan after picking up RX.  Patient Active Problem List   Diagnosis Date Noted  . Hypertension   . Osteoporosis   . Olecranon bursitis   . Hyperlipidemia   . Other shoulder lesions, right shoulder 03/08/2015  . Complete rotator cuff rupture of left shoulder 03/08/2015  . Intertrochanteric fracture of right femur ([place]) 02/04/2015   Past Surgical History:  Procedure Laterality Date  . ABDOMINAL HYSTERECTOMY    . INTRAMEDULLARY (IM) NAIL INTERTROCHANTERIC Right 02/05/2015   Procedure: INTRAMEDULLARY (IM) NAIL INTERTROCHANTRIC;  Surgeon: Corky Mull, MD;  Location: [facility] ORS;  Service: Orthopedics;  Laterality: Right;   No family history on file.   No Known Allergies   Previous Medications   ACETAMINOPHEN (TYLENOL) 325 MG TABLET    Take 2 tablets (650 mg total) by mouth every 6 (six) hours as needed for mild pain (or Fever >/= 101).   CALCIUM CITRATE-VITAMIN D (CALCIUM + D PO)    Take 1 tablet by mouth daily.   CHOLECALCIFEROL (VITAMIN D-3) 1000 UNITS CAPS    Take 1 capsule by mouth daily.   GARLIC PO    Take [id number] mg by mouth daily.    METOPROLOL SUCCINATE (TOPROL-XL) 100 MG 24 HR TABLET    TAKE 1/2 TABLET  ORAL, DAILY BY MOUTH   MULTIPLE VITAMIN (MULTI-VITAMINS) TABS    Take 1 tablet by mouth daily. Reported on 09/12/2015   NEOMYCIN-POLYMYXIN-HYDROCORTISONE (CORTISPORIN) 1 % SOLN OTIC SOLUTION    Place 3 drops into the right ear every 8 (eight) hours.   PRAVASTATIN (PRAVACHOL) 20 MG TABLET    TAKE 1 TABLET (20 MG  TOTAL) BY MOUTH DAILY.   RED YEAST RICE EXTRACT (RED YEAST RICE PO)    Take 600 mg by mouth daily.    TURMERIC CURCUMIN 500 MG CAPS    Take 500 mg by mouth daily.   VITAMIN C (ASCORBIC ACID) 500 MG TABLET    Take 1 tablet by mouth daily.    Review of Systems  Constitutional: Negative.   HENT: Positive for hearing loss.   Respiratory: Negative.   Cardiovascular: Negative.     Social History  Substance Use Topics  . Smoking status: Never Smoker  . Smokeless tobacco: Not on file  . Alcohol use No   Objective:   BP 124/82 (BP Location: Right Arm, Patient Position: Sitting, Cuff Size: Normal)   Pulse 75   Temp 98 F (36.7 C) (Oral)   Resp 16   Wt 147 lb (66.7 kg)   BMI 24.46 kg/m   Physical Exam  Constitutional: She is oriented to person, place, and time. She appears well-developed and well-nourished. No distress.  HENT:  Head: Normocephalic and atraumatic.  Right Ear: Hearing normal.  Left Ear: Hearing and external ear normal.  Nose: Nose normal.  Right ear has large amount of debris in the superior portion of chamber - ?cholesteatoma?[name] test lateralized to the right.  Eyes: Conjunctivae and lids are normal. Right eye exhibits no discharge. Left eye  exhibits no discharge. No scleral icterus.  Pulmonary/Chest: Effort normal. No respiratory distress.  Musculoskeletal: Normal range of motion.  Neurological: She is alert and oriented to person, place, and time.  Skin: Skin is intact. No lesion and no rash noted.  Psychiatric: She has a normal mood and affect. Her speech is normal and behavior is normal. Thought content normal.      Assessment & Plan:     1. Cholesteatoma, right Tried to irrigate cerumen impaction after using Cortisporin ear drops over the past 3 days. Initial irrigation did not clear wax last week. No improvement with irrigation this time. 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 Will schedule referral to ENT before she gets hearing aids. - Ambulatory referral to  ENT  2. Conductive hearing loss of right ear with unrestricted hearing of left ear Weber test lateralizes to the right. Suspected secondary to cholesteatoma on the right. - Ambulatory referral to ENT

## 2016-03-31 DIAGNOSIS — J339 Nasal polyp, unspecified: Secondary | ICD-10-CM | POA: Diagnosis not present

## 2016-03-31 DIAGNOSIS — H6123 Impacted cerumen, bilateral: Secondary | ICD-10-CM | POA: Diagnosis not present

## 2016-05-04 DIAGNOSIS — C44311 Basal cell carcinoma of skin of nose: Secondary | ICD-10-CM | POA: Diagnosis not present

## 2016-05-04 DIAGNOSIS — L570 Actinic keratosis: Secondary | ICD-10-CM | POA: Diagnosis not present

## 2016-05-04 DIAGNOSIS — L720 Epidermal cyst: Secondary | ICD-10-CM | POA: Diagnosis not present

## 2016-05-04 DIAGNOSIS — D229 Melanocytic nevi, unspecified: Secondary | ICD-10-CM | POA: Diagnosis not present

## 2016-05-04 DIAGNOSIS — Z85828 Personal history of other malignant neoplasm of skin: Secondary | ICD-10-CM | POA: Diagnosis not present

## 2016-05-04 DIAGNOSIS — C4491 Basal cell carcinoma of skin, unspecified: Secondary | ICD-10-CM

## 2016-05-04 DIAGNOSIS — L821 Other seborrheic keratosis: Secondary | ICD-10-CM | POA: Diagnosis not present

## 2016-05-04 DIAGNOSIS — L820 Inflamed seborrheic keratosis: Secondary | ICD-10-CM | POA: Diagnosis not present

## 2016-05-04 DIAGNOSIS — D485 Neoplasm of uncertain behavior of skin: Secondary | ICD-10-CM | POA: Diagnosis not present

## 2016-05-04 DIAGNOSIS — L718 Other rosacea: Secondary | ICD-10-CM | POA: Diagnosis not present

## 2016-05-04 DIAGNOSIS — L812 Freckles: Secondary | ICD-10-CM | POA: Diagnosis not present

## 2016-05-04 HISTORY — DX: Basal cell carcinoma of skin, unspecified: C44.91

## 2016-06-16 DIAGNOSIS — C44311 Basal cell carcinoma of skin of nose: Secondary | ICD-10-CM | POA: Diagnosis not present

## 2016-06-16 DIAGNOSIS — C44319 Basal cell carcinoma of skin of other parts of face: Secondary | ICD-10-CM | POA: Diagnosis not present

## 2016-06-16 DIAGNOSIS — L570 Actinic keratosis: Secondary | ICD-10-CM | POA: Diagnosis not present

## 2016-06-22 ENCOUNTER — Other Ambulatory Visit: Payer: Self-pay | Admitting: Family Medicine

## 2016-09-24 DIAGNOSIS — L820 Inflamed seborrheic keratosis: Secondary | ICD-10-CM | POA: Diagnosis not present

## 2016-09-24 DIAGNOSIS — Z85828 Personal history of other malignant neoplasm of skin: Secondary | ICD-10-CM | POA: Diagnosis not present

## 2016-09-24 DIAGNOSIS — D229 Melanocytic nevi, unspecified: Secondary | ICD-10-CM | POA: Diagnosis not present

## 2016-09-24 DIAGNOSIS — L821 Other seborrheic keratosis: Secondary | ICD-10-CM | POA: Diagnosis not present

## 2016-09-24 DIAGNOSIS — L570 Actinic keratosis: Secondary | ICD-10-CM | POA: Diagnosis not present

## 2016-09-29 ENCOUNTER — Other Ambulatory Visit: Payer: Self-pay | Admitting: Family Medicine

## 2016-10-29 ENCOUNTER — Ambulatory Visit (INDEPENDENT_AMBULATORY_CARE_PROVIDER_SITE_OTHER): Payer: PPO | Admitting: Family Medicine

## 2016-10-29 ENCOUNTER — Encounter: Payer: Self-pay | Admitting: Family Medicine

## 2016-10-29 VITALS — BP 138/88 | HR 70 | Temp 97.3°F | Wt 154.2 lb

## 2016-10-29 DIAGNOSIS — Z111 Encounter for screening for respiratory tuberculosis: Secondary | ICD-10-CM

## 2016-10-29 DIAGNOSIS — E782 Mixed hyperlipidemia: Secondary | ICD-10-CM

## 2016-10-29 DIAGNOSIS — I10 Essential (primary) hypertension: Secondary | ICD-10-CM

## 2016-10-29 DIAGNOSIS — Z1159 Encounter for screening for other viral diseases: Secondary | ICD-10-CM

## 2016-10-29 DIAGNOSIS — Z1239 Encounter for other screening for malignant neoplasm of breast: Secondary | ICD-10-CM

## 2016-10-29 DIAGNOSIS — Z1231 Encounter for screening mammogram for malignant neoplasm of breast: Secondary | ICD-10-CM | POA: Diagnosis not present

## 2016-10-29 NOTE — Progress Notes (Signed)
Patient: Stacey [name] Female    DOB: 03/03/1946   71 y.o.   MRN: [id number] Visit [name]: 10/29/2016  Today's Provider: Vernie Murders, PA   Chief Complaint  Patient presents with  . TB test   Subjective:    HPI Patient is here today to request a lab order. 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 Patient also request that maintenance labs be done today since she is fasting. She reports good compliance with medication regimen with no side effects. Only concern today is scaly skin.   Patient Active Problem List   Diagnosis [name] Noted  . Hypertension   . Osteoporosis   . Olecranon bursitis   . Hyperlipidemia   . Other shoulder lesions, right shoulder 03/08/2015  . Complete rotator cuff rupture of left shoulder 03/08/2015  . Intertrochanteric fracture of right femur ([place]) 02/04/2015   Past Surgical History:  Procedure Laterality [name]  . ABDOMINAL HYSTERECTOMY    . INTRAMEDULLARY (IM) NAIL INTERTROCHANTERIC Right 02/05/2015   Procedure: INTRAMEDULLARY (IM) NAIL INTERTROCHANTRIC;  Surgeon: Corky Mull, MD;  Location: [facility] ORS;  Service: Orthopedics;  Laterality: Right;   No family history on file.   No Known Allergies   Previous Medications   ACETAMINOPHEN (TYLENOL) 325 MG TABLET    Take 2 tablets (650 mg total) by mouth every 6 (six) hours as needed for mild pain (or Fever >/= 101).   CALCIUM CITRATE-VITAMIN D (CALCIUM + D PO)    Take 1 tablet by mouth daily.   CHOLECALCIFEROL (VITAMIN D-3) 1000 UNITS CAPS    Take 1 capsule by mouth daily.   GARLIC PO    Take 5,643 mg by mouth daily.    METOPROLOL SUCCINATE (TOPROL-XL) 100 MG 24 HR TABLET    TAKE 1/2 TABLET  ORAL, DAILY BY MOUTH   MULTIPLE VITAMIN (MULTI-VITAMINS) TABS    Take 1 tablet by mouth daily. Reported on 09/12/2015   OMEGA-3 FATTY ACIDS (FISH OIL PO)    Take by mouth.   PRAVASTATIN (PRAVACHOL) 20 MG TABLET    TAKE ONE TABLET BY MOUTH DAILY   RED YEAST RICE EXTRACT (RED YEAST  RICE PO)    Take 600 mg by mouth daily.    TURMERIC CURCUMIN 500 MG CAPS    Take 500 mg by mouth daily.   VITAMIN C (ASCORBIC ACID) 500 MG TABLET    Take 1 tablet by mouth daily.    Review of Systems  Constitutional: Negative.   Respiratory: Negative.   Cardiovascular: Negative.   Gastrointestinal: Negative.   Genitourinary: Negative.   Musculoskeletal: Negative.     Social History  Substance Use Topics  . Smoking status: Never Smoker  . Smokeless tobacco: Never Used  . Alcohol use No   Objective:   BP 138/88 (BP Location: Right Arm, Patient Position: Sitting, Cuff Size: Normal)   Pulse 70   Temp (!) 97.3 F (36.3 C) (Oral)   Wt 154 lb 3.2 oz (69.9 kg)   SpO2 99%   BMI 25.66 kg/m   Physical Exam  Constitutional: She is oriented to person, place, and time. She appears well-developed and well-nourished. No distress.  HENT:  Head: Normocephalic and atraumatic.  Right Ear: Hearing and external ear normal.  Left Ear: Hearing and external ear normal.  Nose: Nose normal.  Mouth/Throat: Oropharynx is clear and moist.  Eyes: Conjunctivae and lids are normal. Right eye exhibits no discharge. Left eye exhibits no discharge. No  scleral icterus.  Neck: Neck supple.  Cardiovascular: Normal rate and regular rhythm.   Pulmonary/Chest: Effort normal and breath sounds normal. No respiratory distress.  Abdominal: Soft. Bowel sounds are normal.  Musculoskeletal: Normal range of motion.  Left knee slightly larger than the right. Well healed scars from right hip replacement and left knee fracture repair.  Neurological: She is alert and oriented to person, place, and time.  Skin: Skin is intact. No lesion and no rash noted.  Psychiatric: She has a normal mood and affect. Her speech is normal and behavior is normal. Thought content normal.      Assessment & Plan:     1. Essential hypertension Well controlled. Tolerating Metoprolol Succinate 100 mg qd without side effects. Check routine  labs and recommend she schedule for Medicare Wellness Screening Exam. - CBC with Differential/Platelet - Comprehensive metabolic panel - TSH  2. Mixed hyperlipidemia Tolerating Pravastatin and low fat diet without side effects. Recheck labs. - Comprehensive metabolic panel - Lipid panel - TSH  3. 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 - Quantiferon tb gold assay  4. Screening for breast cancer - MM Digital Screening  5. Need for hepatitis C screening test - Hepatitis C Antibody

## 2016-11-04 LAB — CBC WITH DIFFERENTIAL/PLATELET
BASOS ABS: 0 10*3/uL (ref 0.0–0.2)
Basos: 1 %
EOS (ABSOLUTE): 0.1 10*3/uL (ref 0.0–0.4)
Eos: 1 %
HEMOGLOBIN: 15.1 g/dL (ref 11.1–15.9)
Hematocrit: 44.6 % (ref 34.0–46.6)
Immature Grans (Abs): 0 10*3/uL (ref 0.0–0.1)
Immature Granulocytes: 0 %
LYMPHS ABS: 4.5 10*3/uL — AB (ref 0.7–3.1)
Lymphs: 52 %
MCH: 30.8 pg (ref 26.6–33.0)
MCHC: 33.9 g/dL (ref 31.5–35.7)
MCV: 91 fL (ref 79–97)
MONOCYTES: 4 %
Monocytes Absolute: 0.4 10*3/uL (ref 0.1–0.9)
NEUTROS ABS: 3.7 10*3/uL (ref 1.4–7.0)
Neutrophils: 42 %
PLATELETS: 191 10*3/uL (ref 150–379)
RBC: 4.9 x10E6/uL (ref 3.77–5.28)
RDW: 13.8 % (ref 12.3–15.4)
WBC: 8.7 10*3/uL (ref 3.4–10.8)

## 2016-11-04 LAB — COMPREHENSIVE METABOLIC PANEL
ALK PHOS: 87 IU/L (ref 39–117)
ALT: 13 IU/L (ref 0–32)
AST: 23 IU/L (ref 0–40)
Albumin/Globulin Ratio: 2.3 — ABNORMAL HIGH (ref 1.2–2.2)
Albumin: 4.4 g/dL (ref 3.5–4.8)
BILIRUBIN TOTAL: 0.7 mg/dL (ref 0.0–1.2)
BUN/Creatinine Ratio: 16 (ref 12–28)
BUN: 16 mg/dL (ref 8–27)
CHLORIDE: 105 mmol/L (ref 96–106)
CO2: 23 mmol/L (ref 20–29)
CREATININE: 0.98 mg/dL (ref 0.57–1.00)
Calcium: 9.7 mg/dL (ref 8.7–10.3)
GFR calc Af Amer: 67 mL/min/{1.73_m2} (ref >59)
GFR calc non Af Amer: 58 mL/min/{1.73_m2} — ABNORMAL LOW (ref >59)
GLUCOSE: 85 mg/dL (ref 65–99)
Globulin, Total: 1.9 g/dL (ref 1.5–4.5)
Potassium: 4.2 mmol/L (ref 3.5–5.2)
Sodium: 143 mmol/L (ref 134–144)
Total Protein: 6.3 g/dL (ref 6.0–8.5)

## 2016-11-04 LAB — LIPID PANEL
CHOLESTEROL TOTAL: 191 mg/dL (ref 100–199)
Chol/HDL Ratio: 2.9 ratio (ref 0.0–4.4)
HDL: 66 mg/dL (ref >39)
LDL CALC: 107 mg/dL — AB (ref 0–99)
TRIGLYCERIDES: 91 mg/dL (ref 0–149)
VLDL CHOLESTEROL CAL: 18 mg/dL (ref 5–40)

## 2016-11-04 LAB — QUANTIFERON IN TUBE
QFT TB AG MINUS NIL VALUE: 0.02 [IU]/mL
QUANTIFERON MITOGEN VALUE: >10 IU/mL
QUANTIFERON TB AG VALUE: 0.08 IU/mL
QUANTIFERON TB GOLD: NEGATIVE
Quantiferon Nil Value: 0.06 IU/mL

## 2016-11-04 LAB — TSH: TSH: 2.38 u[IU]/mL (ref 0.450–4.500)

## 2016-11-04 LAB — QUANTIFERON TB GOLD ASSAY (BLOOD)

## 2016-11-04 LAB — HEPATITIS C ANTIBODY

## 2016-11-06 ENCOUNTER — Ambulatory Visit: Payer: PPO

## 2016-11-09 ENCOUNTER — Other Ambulatory Visit: Payer: Self-pay | Admitting: Family Medicine

## 2016-11-10 ENCOUNTER — Ambulatory Visit: Payer: PPO

## 2016-11-10 ENCOUNTER — Ambulatory Visit (INDEPENDENT_AMBULATORY_CARE_PROVIDER_SITE_OTHER): Payer: PPO

## 2016-11-10 VITALS — BP 146/92 | HR 76 | Temp 98.1°F | Ht 65.0 in | Wt 155.6 lb

## 2016-11-10 DIAGNOSIS — Z0000 Encounter for general adult medical examination without abnormal findings: Secondary | ICD-10-CM | POA: Diagnosis not present

## 2016-11-10 DIAGNOSIS — Z1211 Encounter for screening for malignant neoplasm of colon: Secondary | ICD-10-CM

## 2016-11-10 NOTE — Patient Instructions (Signed)
[name] , Thank you for taking time to come for your Medicare Wellness Visit. I appreciate your ongoing commitment to your health goals. Please review the following plan we discussed and let me know if I can assist you in the future.   Screening recommendations/referrals: Colonoscopy: cologuard order sent today Mammogram: pt to set up this year Bone Density: completed 05/07/14 Recommended yearly ophthalmology/optometry visit for glaucoma screening and checkup Recommended yearly dental visit for hygiene and checkup  Vaccinations: Influenza vaccine: due fall 2018 Pneumococcal vaccine: declined Tdap vaccine: completed 03/11/09, due 02/2019 Shingles vaccine: declined    Advanced directives: Please bring a copy of your POA (Power of [place]) and/or Living Will to your next appointment.   Conditions/risks identified: Recommend increasing water intake to 4-6 glasses a day.   Next appointment: None, need to schedule physical with PCP and 1 year AWV.   Preventive Care 20 Years and Older, Female Preventive care refers to lifestyle choices and visits with your health care provider that can promote health and wellness. What does preventive care include?  A yearly physical exam. This is also called an annual well check.  Dental exams once or twice a year.  Routine eye exams. Ask your health care provider how often you should have your eyes checked.  Personal lifestyle choices, including:  Daily care of your teeth and gums.  Regular physical activity.  Eating a healthy diet.  Avoiding tobacco and drug use.  Limiting alcohol use.  Practicing safe sex.  Taking low-dose aspirin every day.  Taking vitamin and mineral supplements as recommended by your health care provider. What happens during an annual well check? The services and screenings done by your health care provider during your annual well check will depend on your age, overall health, lifestyle risk factors, and  family history of disease. Counseling  Your health care provider may ask you questions about your:  Alcohol use.  Tobacco use.  Drug use.  Emotional well-being.  Home and relationship well-being.  Sexual activity.  Eating habits.  History of falls.  Memory and ability to understand (cognition).  Work and work Statistician.  Reproductive health. Screening  You may have the following tests or measurements:  Height, weight, and BMI.  Blood pressure.  Lipid and cholesterol levels. These may be checked every 5 years, or more frequently if you are over 97 years old.  Skin check.  Lung cancer screening. You may have this screening every year starting at age 58 if you have a 30-pack-year history of smoking and currently smoke or have quit within the past 15 years.  Fecal occult blood test (FOBT) of the stool. You may have this test every year starting at age 18.  Flexible sigmoidoscopy or colonoscopy. You may have a sigmoidoscopy every 5 years or a colonoscopy every 10 years starting at age 90.  Hepatitis C blood test.  Hepatitis B blood test.  Sexually transmitted disease (STD) testing.  Diabetes screening. This is done by checking your blood sugar (glucose) after you have not eaten for a while (fasting). You may have this done every 1-3 years.  Bone density scan. This is done to screen for osteoporosis. You may have this done starting at age 78.  Mammogram. This may be done every 1-2 years. Talk to your health care provider about how often you should have regular mammograms. Talk with your health care provider about your test results, treatment options, and if necessary, the need for more tests. Vaccines  Your health care  provider may recommend certain vaccines, such as:  Influenza vaccine. This is recommended every year.  Tetanus, diphtheria, and acellular pertussis (Tdap, Td) vaccine. You may need a Td booster every 10 years.  Zoster vaccine. You may need this  after age 82.  Pneumococcal 13-valent conjugate (PCV13) vaccine. One dose is recommended after age 3.  Pneumococcal polysaccharide (PPSV23) vaccine. One dose is recommended after age 51. Talk to your health care provider about which screenings and vaccines you need and how often you need them. This information is not intended to replace advice given to you by your health care provider. Make sure you discuss any questions you have with your health care provider. Document Released: 04/12/2015 Document Revised: 12/04/2015 Document Reviewed: 01/15/2015 Elsevier Interactive Patient Education  2017 [employer] Prevention in the Home Falls can cause injuries. They can happen to people of all ages. There are many things you can do to make your home safe and to help prevent falls. What can I do on the outside of my home?  Regularly fix the edges of walkways and driveways and fix any cracks.  Remove anything that might make you trip as you walk through a door, such as a raised step or threshold.  Trim any bushes or trees on the path to your home.  Use bright outdoor lighting.  Clear any walking paths of anything that might make someone trip, such as rocks or tools.  Regularly check to see if handrails are loose or broken. Make sure that both sides of any steps have handrails.  Any raised decks and porches should have guardrails on the edges.  Have any leaves, snow, or ice cleared regularly.  Use sand or salt on walking paths during winter.  Clean up any spills in your garage right away. This includes oil or grease spills. What can I do in the bathroom?  Use night lights.  Install grab bars by the toilet and in the tub and shower. Do not use towel bars as grab bars.  Use non-skid mats or decals in the tub or shower.  If you need to sit down in the shower, use a plastic, non-slip stool.  Keep the floor dry. Clean up any water that spills on the floor as soon as it  happens.  Remove soap buildup in the tub or shower regularly.  Attach bath mats securely with double-sided non-slip rug tape.  Do not have throw rugs and other things on the floor that can make you trip. What can I do in the bedroom?  Use night lights.  Make sure that you have a light by your bed that is easy to reach.  Do not use any sheets or blankets that are too big for your bed. They should not hang down onto the floor.  Have a firm chair that has side arms. You can use this for support while you get dressed.  Do not have throw rugs and other things on the floor that can make you trip. What can I do in the kitchen?  Clean up any spills right away.  Avoid walking on wet floors.  Keep items that you use a lot in easy-to-reach places.  If you need to reach something above you, use a strong step stool that has a grab bar.  Keep electrical cords out of the way.  Do not use floor polish or wax that makes floors slippery. If you must use wax, use non-skid floor wax.  Do not have throw  rugs and other things on the floor that can make you trip. What can I do with my stairs?  Do not leave any items on the stairs.  Make sure that there are handrails on both sides of the stairs and use them. Fix handrails that are broken or loose. Make sure that handrails are as long as the stairways.  Check any carpeting to make sure that it is firmly attached to the stairs. Fix any carpet that is loose or worn.  Avoid having throw rugs at the top or bottom of the stairs. If you do have throw rugs, attach them to the floor with carpet tape.  Make sure that you have a light switch at the top of the stairs and the bottom of the stairs. If you do not have them, ask someone to add them for you. What else can I do to help prevent falls?  Wear shoes that:  Do not have high heels.  Have rubber bottoms.  Are comfortable and fit you well.  Are closed at the toe. Do not wear sandals.  If you  use a stepladder:  Make sure that it is fully opened. Do not climb a closed stepladder.  Make sure that both sides of the stepladder are locked into place.  Ask someone to hold it for you, if possible.  Clearly mark and make sure that you can see:  Any grab bars or handrails.  First and last steps.  Where the edge of each step is.  Use tools that help you move around (mobility aids) if they are needed. These include:  Canes.  Walkers.  Scooters.  Crutches.  Turn on the lights when you go into a dark area. Replace any light bulbs as soon as they burn out.  Set up your furniture so you have a clear path. Avoid moving your furniture around.  If any of your floors are uneven, fix them.  If there are any pets around you, be aware of where they are.  Review your medicines with your doctor. Some medicines can make you feel dizzy. This can increase your chance of falling. Ask your doctor what other things that you can do to help prevent falls. This information is not intended to replace advice given to you by your health care provider. Make sure you discuss any questions you have with your health care provider. Document Released: 01/10/2009 Document Revised: 08/22/2015 Document Reviewed: 04/20/2014 Elsevier Interactive Patient Education  2017 [employer].

## 2016-11-10 NOTE — Progress Notes (Signed)
Subjective:   Stacey [name] is a 71 y.o. female who presents for Medicare Annual (Subsequent) preventive examination.  Review of Systems:  N/A  Cardiac Risk Factors include: advanced age (>45men, >30 women);dyslipidemia;hypertension     Objective:     Vitals: BP (!) 146/92 (BP Location: Left Arm)   Pulse 76   Temp 98.1 F (36.7 C) (Oral)   Ht 5\' 5"  (1.651 m)   Wt 155 lb 9.6 oz (70.6 kg)   BMI 25.89 kg/m   Body mass index is 25.89 kg/m.   Tobacco History  Smoking Status  . Never Smoker  Smokeless Tobacco  . Never Used     Counseling given: Not Answered   Past Medical History:  Diagnosis Date  . Hyperlipidemia   . Hypertension   . Olecranon bursitis    left   . Osteoporosis    Past Surgical History:  Procedure Laterality Date  . ABDOMINAL HYSTERECTOMY    . INTRAMEDULLARY (IM) NAIL INTERTROCHANTERIC Right 02/05/2015   Procedure: INTRAMEDULLARY (IM) NAIL INTERTROCHANTRIC;  Surgeon: Corky Mull, MD;  Location: [facility] ORS;  Service: Orthopedics;  Laterality: Right;   Family History  Problem Relation Age of Onset  . Cancer Mother        colon  . Cancer Father        lung cancer   History  Sexual Activity  . Sexual activity: No    Outpatient Encounter Prescriptions as of 11/10/2016  Medication Sig  . Calcium Citrate-Vitamin D (CALCIUM + D PO) Take 1 tablet by mouth daily.  . Cholecalciferol (VITAMIN D-3) 1000 UNITS CAPS Take 1 capsule by mouth daily.  . Coenzyme Q10 (COQ-10) 100 MG CAPS Take 100 mg by mouth daily.  Marland Kitchen GARLIC PO Take 5,366 mg by mouth daily.   . metoprolol succinate (TOPROL-XL) 100 MG 24 hr tablet TAKE 1/2 TABLET  ORAL, DAILY BY MOUTH  . Omega-3 Fatty Acids (FISH OIL PO) Take 300 mg by mouth.   . pravastatin (PRAVACHOL) 20 MG tablet TAKE ONE TABLET BY MOUTH DAILY  . Red Yeast Rice Extract (RED YEAST RICE PO) Take 600 mg by mouth daily.   . Turmeric Curcumin 500 MG CAPS Take 500 mg by mouth daily.  . vitamin C (ASCORBIC ACID) 500 MG  tablet Take 1,000 mg by mouth daily.   Marland Kitchen acetaminophen (TYLENOL) 325 MG tablet Take 2 tablets (650 mg total) by mouth every 6 (six) hours as needed for mild pain (or Fever >/= 101). (Patient not taking: Reported on 11/10/2016)  . [DISCONTINUED] Multiple Vitamin (MULTI-VITAMINS) TABS Take 1 tablet by mouth daily. Reported on 09/12/2015   No facility-administered encounter medications on file as of 11/10/2016.     Activities of Daily Living In your present state of health, do you have any difficulty performing the following activities: 11/10/2016 10/29/2016  Hearing? Tempie Donning  Vision? N N  Difficulty concentrating or making decisions? N N  Walking or climbing stairs? Y Y  Dressing or bathing? N N  Doing errands, shopping? N N  Preparing Food and eating ? N -  Using the Toilet? N -  In the past six months, have you accidently leaked urine? N -  Do you have problems with loss of bowel control? N -  Managing your Medications? N -  Managing your Finances? N -  Housekeeping or managing your Housekeeping? N -  Some recent data might be hidden    Patient Care Team: Chrismon, Vickki Muff, PA as PCP - General (  Family Medicine) Anell Barr, OD as Consulting Physician (Optometry) Ralene Bathe, MD as Consulting Physician (Dermatology)    Assessment:     Exercise Activities and Dietary recommendations Current Exercise Habits: Home exercise routine, Type of exercise: walking, Time (Minutes): 15, Frequency (Times/Week): 7, Weekly Exercise (Minutes/Week): 105, Intensity: Mild, Exercise limited by: None identified  Goals    . Increase water intake          Recommend increasing water intake to 4-6 glasses a day.       Fall Risk Fall Risk  11/10/2016 10/29/2016  Falls in the past year? No Yes  Number falls in past yr: - 1  Injury with Fall? - Yes   Depression Screen PHQ 2/9 Scores 11/10/2016 10/29/2016  PHQ - 2 Score 0 0     Cognitive Function        Immunization History  Administered  Date(s) Administered  . Td 03/11/2009   Screening Tests Health Maintenance  Topic Date Due  . INFLUENZA VACCINE  12/29/2016 (Originally 10/28/2016)  . MAMMOGRAM  02/27/2017 (Originally 10/10/1994)  . COLONOSCOPY  02/27/2017 (Originally 10/10/1994)  . PNA vac Low Risk Adult (1 of 2 - PCV13) 03/30/2026 (Originally 10/09/2009)  . TETANUS/TDAP  03/12/2019  . DEXA SCAN  Completed  . Hepatitis C Screening  Completed      Plan:  I have personally reviewed and addressed the Medicare Annual Wellness questionnaire and have noted the following in the patient's chart:  A. Medical and social history B. Use of alcohol, tobacco or illicit drugs  C. Current medications and supplements D. Functional ability and status E.  Nutritional status F.  Physical activity G. Advance directives H. List of other physicians I.  Hospitalizations, surgeries, and ER visits in previous 12 months J.  [place] such as hearing and vision if needed, cognitive and depression L. Referrals and appointments - none  In addition, I have reviewed and discussed with patient certain preventive protocols, quality metrics, and best practice recommendations. A written personalized care plan for preventive services as well as general preventive health recommendations were provided to patient.  See attached scanned questionnaire for additional information.   Signed,  Fabio Neighbors, LPN Nurse Health Advisor   MD Recommendations: None. Pt to set up mammogram this year. Order for cologuard sent today. Pt declined pneumonia vaccine.   Reviewed Nurse Health Advisor's documentation and recommendations. Agree with plan and will follow up as needed.

## 2016-11-23 DIAGNOSIS — H2513 Age-related nuclear cataract, bilateral: Secondary | ICD-10-CM | POA: Diagnosis not present

## 2016-11-25 DIAGNOSIS — L820 Inflamed seborrheic keratosis: Secondary | ICD-10-CM | POA: Diagnosis not present

## 2016-11-25 DIAGNOSIS — L821 Other seborrheic keratosis: Secondary | ICD-10-CM | POA: Diagnosis not present

## 2016-11-25 DIAGNOSIS — D229 Melanocytic nevi, unspecified: Secondary | ICD-10-CM | POA: Diagnosis not present

## 2016-11-25 DIAGNOSIS — Z85828 Personal history of other malignant neoplasm of skin: Secondary | ICD-10-CM | POA: Diagnosis not present

## 2016-11-25 DIAGNOSIS — L812 Freckles: Secondary | ICD-10-CM | POA: Diagnosis not present

## 2016-11-25 DIAGNOSIS — L578 Other skin changes due to chronic exposure to nonionizing radiation: Secondary | ICD-10-CM | POA: Diagnosis not present

## 2016-12-16 DIAGNOSIS — Z1211 Encounter for screening for malignant neoplasm of colon: Secondary | ICD-10-CM | POA: Diagnosis not present

## 2016-12-16 DIAGNOSIS — Z1212 Encounter for screening for malignant neoplasm of rectum: Secondary | ICD-10-CM | POA: Diagnosis not present

## 2016-12-16 LAB — COLOGUARD

## 2016-12-22 LAB — COLOGUARD: COLOGUARD: NEGATIVE

## 2016-12-27 ENCOUNTER — Other Ambulatory Visit: Payer: Self-pay | Admitting: Family Medicine

## 2017-03-28 ENCOUNTER — Other Ambulatory Visit: Payer: Self-pay | Admitting: Family Medicine

## 2017-09-27 ENCOUNTER — Other Ambulatory Visit: Payer: Self-pay | Admitting: Family Medicine

## 2017-10-22 ENCOUNTER — Telehealth: Payer: Self-pay

## 2017-10-22 NOTE — Telephone Encounter (Signed)
LM for pt to CB. Pt is due to schedule for AWV after 11/10/17. -MM

## 2017-11-01 ENCOUNTER — Telehealth: Payer: Self-pay

## 2017-11-01 NOTE — Telephone Encounter (Signed)
LMTCB and schedule AWV and CPE. -MM 

## 2017-11-06 ENCOUNTER — Other Ambulatory Visit: Payer: Self-pay | Admitting: Family Medicine

## 2017-11-11 ENCOUNTER — Ambulatory Visit: Payer: PPO | Admitting: Family Medicine

## 2017-12-01 ENCOUNTER — Telehealth: Payer: Self-pay

## 2017-12-01 NOTE — Telephone Encounter (Signed)
LMTCB and schedule AWV.  -MM 

## 2017-12-13 NOTE — Telephone Encounter (Signed)
Tried to contact pt on multiple occasions (see previous TE's) and pt has not CB and r/s apt. Closing TE. -MM

## 2017-12-27 ENCOUNTER — Other Ambulatory Visit: Payer: Self-pay | Admitting: Family Medicine

## 2018-01-06 ENCOUNTER — Ambulatory Visit (INDEPENDENT_AMBULATORY_CARE_PROVIDER_SITE_OTHER): Payer: PPO | Admitting: Family Medicine

## 2018-01-06 ENCOUNTER — Encounter: Payer: Self-pay | Admitting: Family Medicine

## 2018-01-06 VITALS — BP 120/80 | HR 75 | Temp 98.3°F | Wt 150.8 lb

## 2018-01-06 DIAGNOSIS — I10 Essential (primary) hypertension: Secondary | ICD-10-CM | POA: Diagnosis not present

## 2018-01-06 DIAGNOSIS — M810 Age-related osteoporosis without current pathological fracture: Secondary | ICD-10-CM

## 2018-01-06 DIAGNOSIS — Z96641 Presence of right artificial hip joint: Secondary | ICD-10-CM | POA: Diagnosis not present

## 2018-01-06 DIAGNOSIS — E782 Mixed hyperlipidemia: Secondary | ICD-10-CM | POA: Diagnosis not present

## 2018-01-06 DIAGNOSIS — Z2821 Immunization not carried out because of patient refusal: Secondary | ICD-10-CM | POA: Diagnosis not present

## 2018-01-06 MED ORDER — PRAVASTATIN SODIUM 20 MG PO TABS: 20.0000 mg | ORAL_TABLET | Freq: Every day | ORAL | 3 refills | Status: DC

## 2018-01-06 NOTE — Progress Notes (Signed)
Patient: Stacey [name] Female    DOB: [birth year]   72 y.o.   MRN: [id number] Visit Date: 01/06/2018  Today's Provider: Vernie Murders, PA   Chief Complaint  Patient presents with  . Hyperlipidemia   Subjective:    HPI Patient presents today for Hyperlipidemia follow-up. Patients she is not having any symptoms at this time and feels well over all.    Past Medical History:  Diagnosis Date  . Hyperlipidemia   . Hypertension   . Olecranon bursitis    left   . Osteoporosis    Past Surgical History:  Procedure Laterality Date  . ABDOMINAL HYSTERECTOMY    . INTRAMEDULLARY (IM) NAIL INTERTROCHANTERIC Right 02/05/2015   Procedure: INTRAMEDULLARY (IM) NAIL INTERTROCHANTRIC;  Surgeon: Corky Mull, MD;  Location: [facility] ORS;  Service: Orthopedics;  Laterality: Right;   Family History  Problem Relation Age of Onset  . Cancer Mother        colon  . Cancer Father        lung cancer   No Known Allergies  Current Outpatient Medications:  .  acetaminophen (TYLENOL) 325 MG tablet, Take 2 tablets (650 mg total) by mouth every 6 (six) hours as needed for mild pain (or Fever >/= 101)., Disp: , Rfl:  .  Calcium Citrate-Vitamin D (CALCIUM + D PO), Take 1 tablet by mouth daily., Disp: , Rfl:  .  Cholecalciferol (VITAMIN D-3) 1000 UNITS CAPS, Take 1 capsule by mouth daily., Disp: , Rfl:  .  Coenzyme Q10 (COQ-10) 100 MG CAPS, Take 100 mg by mouth daily., Disp: , Rfl:  .  GARLIC PO, Take 4,967 mg by mouth daily. , Disp: , Rfl:  .  metoprolol succinate (TOPROL-XL) 100 MG 24 hr tablet, TAKE 0.5 TABLETS BY MOUTH DAILY, Disp: 45 tablet, Rfl: 2 .  Omega-3 Fatty Acids (FISH OIL PO), Take 300 mg by mouth. , Disp: , Rfl:  .  pravastatin (PRAVACHOL) 20 MG tablet, TAKE ONE TABLET BY MOUTH DAILY, Disp: 90 tablet, Rfl: 0 .  Red Yeast Rice Extract (RED YEAST RICE PO), Take 600 mg by mouth daily. , Disp: , Rfl:  .  Turmeric Curcumin 500 MG CAPS, Take 500 mg by mouth daily., Disp: , Rfl:  .   vitamin C (ASCORBIC ACID) 500 MG tablet, Take 1,000 mg by mouth daily. , Disp: , Rfl:   Review of Systems  Constitutional: Negative.   Respiratory: Negative.   Gastrointestinal: Negative.   Musculoskeletal: Negative.    Social History   Tobacco Use  . Smoking status: Never Smoker  . Smokeless tobacco: Never Used  Substance Use Topics  . Alcohol use: No   Objective:   BP 120/80 (BP Location: Left Arm, Patient Position: Sitting, Cuff Size: Normal)   Pulse 75   Temp 98.3 F (36.8 C) (Oral)   Wt 150 lb 12.8 oz (68.4 kg)   SpO2 98%   BMI 25.09 kg/m   Wt Readings from Last 3 Encounters:  01/06/18 150 lb 12.8 oz (68.4 kg)  11/10/16 155 lb 9.6 oz (70.6 kg)  10/29/16 154 lb 3.2 oz (69.9 kg)   Vitals:   01/06/18 0954  BP: 120/80  Pulse: 75  Temp: 98.3 F (36.8 C)  TempSrc: Oral  SpO2: 98%  Weight: 150 lb 12.8 oz (68.4 kg)   Physical Exam  Constitutional: She is oriented to person, place, and time. She appears well-developed and well-nourished. No distress.  HENT:  Head: Normocephalic and atraumatic.  Right Ear: Hearing and external ear normal.  Left Ear: Hearing and external ear normal.  Nose: Nose normal.  Mouth/Throat: Oropharynx is clear and moist.  Eyes: Conjunctivae, EOM and lids are normal. Right eye exhibits no discharge. Left eye exhibits no discharge. No scleral icterus.  Neck: Normal range of motion. Neck supple.  Cardiovascular: Normal rate, regular rhythm and normal heart sounds.  Pulmonary/Chest: Effort normal and breath sounds normal. No respiratory distress.  Abdominal: Soft. Bowel sounds are normal.  Genitourinary:  Genitourinary Comments: History of hysterectomy for DUB 23 years ago. No cancer on path report.  Musculoskeletal: Normal range of motion.  Neurological: She is alert and oriented to person, place, and time.  Skin: Skin is intact. No lesion and no rash noted.  Psychiatric: She has a normal mood and affect. Her speech is normal and behavior is  normal. Thought content normal.   Depression screen [facility] 2/9 11/10/2016 10/29/2016  Decreased Interest 0 0  Down, Depressed, Hopeless 0 0  PHQ - 2 Score 0 0     Assessment & Plan:      1. Essential hypertension Well controlled BP and continues Metoprolol Succinate 100 mg 1/2 tablet daily. No chest pains, dizziness, palpitations or peripheral edema. Recheck CBC, CMP and TSH. Follow up pending reports. - CBC with Differential/Platelet - Comprehensive metabolic panel - TSH  2. Mixed hyperlipidemia Tolerating Pravastatin 20 mg qd with Omega-3 Fish Oil 300 mg qd and Red Yeast Rice 600 mg qd. Encouraged to continue low fat diet and walking exercise. Recheck labs and refill Pravastatin. May not need Red Yeast Rice pending reports. - CBC with Differential/Platelet - Comprehensive metabolic panel - Lipid panel - TSH - pravastatin (PRAVACHOL) 20 MG tablet; Take 1 tablet (20 mg total) by mouth daily.  Dispense: 90 tablet; Refill: 3  3. History of right hip replacement Golden Circle on to the right hip on 02-04-15 requiring intramedullary nail for intertrochantric fracture. Has to use a lift in the left shoe but no pain and normal ambulation.  4. Age-related osteoporosis without current pathological fracture Still taking Calcium and Vitamin-D supplementation. History of fracture of the right hip and a stress fracture in the left foot. Stopped taking the Evista. Check labs and recommend scheduling medicare wellness exam. Declines BMD and mammograms. - CBC with Differential/Platelet - Comprehensive metabolic panel - VITAMIN D 25 Hydroxy (Vit-D Deficiency, Fractures)  5. Refused influenza vaccine   6. Refused Streptococcus pneumoniae vaccination        Vernie Murders, PA  [facility]

## 2018-01-25 DIAGNOSIS — E782 Mixed hyperlipidemia: Secondary | ICD-10-CM | POA: Diagnosis not present

## 2018-01-25 DIAGNOSIS — M810 Age-related osteoporosis without current pathological fracture: Secondary | ICD-10-CM | POA: Diagnosis not present

## 2018-01-25 DIAGNOSIS — I10 Essential (primary) hypertension: Secondary | ICD-10-CM | POA: Diagnosis not present

## 2018-01-26 LAB — CBC WITH DIFFERENTIAL/PLATELET
Basophils Absolute: 0 10*3/uL (ref 0.0–0.2)
Basos: 0 %
EOS (ABSOLUTE): 0.2 10*3/uL (ref 0.0–0.4)
Eos: 2 %
HEMATOCRIT: 42.7 % (ref 34.0–46.6)
Hemoglobin: 14.3 g/dL (ref 11.1–15.9)
IMMATURE GRANULOCYTES: 0 %
Immature Grans (Abs): 0 10*3/uL (ref 0.0–0.1)
Lymphocytes Absolute: 4.5 10*3/uL — ABNORMAL HIGH (ref 0.7–3.1)
Lymphs: 50 %
MCH: 30.6 pg (ref 26.6–33.0)
MCHC: 33.5 g/dL (ref 31.5–35.7)
MCV: 91 fL (ref 79–97)
MONOCYTES: 5 %
MONOS ABS: 0.4 10*3/uL (ref 0.1–0.9)
NEUTROS PCT: 43 %
Neutrophils Absolute: 3.9 10*3/uL (ref 1.4–7.0)
Platelets: 157 10*3/uL (ref 150–450)
RBC: 4.67 x10E6/uL (ref 3.77–5.28)
RDW: 12.7 % (ref 12.3–15.4)
WBC: 9 10*3/uL (ref 3.4–10.8)

## 2018-01-26 LAB — COMPREHENSIVE METABOLIC PANEL
ALT: 12 IU/L (ref 0–32)
AST: 19 IU/L (ref 0–40)
Albumin/Globulin Ratio: 2.3 — ABNORMAL HIGH (ref 1.2–2.2)
Albumin: 4.2 g/dL (ref 3.5–4.8)
Alkaline Phosphatase: 73 IU/L (ref 39–117)
BUN/Creatinine Ratio: 18 (ref 12–28)
BUN: 18 mg/dL (ref 8–27)
Bilirubin Total: 0.6 mg/dL (ref 0.0–1.2)
CALCIUM: 9.6 mg/dL (ref 8.7–10.3)
CO2: 21 mmol/L (ref 20–29)
CREATININE: 1 mg/dL (ref 0.57–1.00)
Chloride: 106 mmol/L (ref 96–106)
GFR calc Af Amer: 65 mL/min/{1.73_m2} (ref >59)
GFR, EST NON AFRICAN AMERICAN: 56 mL/min/{1.73_m2} — AB (ref >59)
Globulin, Total: 1.8 g/dL (ref 1.5–4.5)
Glucose: 85 mg/dL (ref 65–99)
Potassium: 4.2 mmol/L (ref 3.5–5.2)
Sodium: 145 mmol/L — ABNORMAL HIGH (ref 134–144)
Total Protein: 6 g/dL (ref 6.0–8.5)

## 2018-01-26 LAB — VITAMIN D 25 HYDROXY (VIT D DEFICIENCY, FRACTURES): Vit D, 25-Hydroxy: 60.8 ng/mL (ref 30.0–100.0)

## 2018-01-26 LAB — LIPID PANEL
CHOL/HDL RATIO: 2.9 ratio (ref 0.0–4.4)
Cholesterol, Total: 169 mg/dL (ref 100–199)
HDL: 58 mg/dL (ref >39)
LDL CALC: 96 mg/dL (ref 0–99)
Triglycerides: 76 mg/dL (ref 0–149)
VLDL Cholesterol Cal: 15 mg/dL (ref 5–40)

## 2018-01-26 LAB — TSH: TSH: 2.02 u[IU]/mL (ref 0.450–4.500)

## 2018-08-08 ENCOUNTER — Other Ambulatory Visit: Payer: Self-pay | Admitting: Family Medicine

## 2018-09-08 DIAGNOSIS — W19XXXA Unspecified fall, initial encounter: Secondary | ICD-10-CM | POA: Diagnosis not present

## 2018-09-08 DIAGNOSIS — R52 Pain, unspecified: Secondary | ICD-10-CM | POA: Diagnosis not present

## 2018-09-08 DIAGNOSIS — S72002A Fracture of unspecified part of neck of left femur, initial encounter for closed fracture: Secondary | ICD-10-CM | POA: Insufficient documentation

## 2018-09-08 DIAGNOSIS — M25552 Pain in left hip: Secondary | ICD-10-CM | POA: Insufficient documentation

## 2018-09-08 DIAGNOSIS — S72355A Nondisplaced comminuted fracture of shaft of left femur, initial encounter for closed fracture: Secondary | ICD-10-CM | POA: Diagnosis not present

## 2018-09-08 DIAGNOSIS — Z96641 Presence of right artificial hip joint: Secondary | ICD-10-CM | POA: Diagnosis not present

## 2018-09-08 DIAGNOSIS — R5381 Other malaise: Secondary | ICD-10-CM | POA: Diagnosis not present

## 2018-09-08 DIAGNOSIS — R279 Unspecified lack of coordination: Secondary | ICD-10-CM | POA: Diagnosis not present

## 2018-09-08 DIAGNOSIS — M810 Age-related osteoporosis without current pathological fracture: Secondary | ICD-10-CM | POA: Diagnosis not present

## 2018-09-08 DIAGNOSIS — M6281 Muscle weakness (generalized): Secondary | ICD-10-CM | POA: Diagnosis not present

## 2018-09-08 DIAGNOSIS — Z7982 Long term (current) use of aspirin: Secondary | ICD-10-CM | POA: Diagnosis not present

## 2018-09-08 DIAGNOSIS — M80052A Age-related osteoporosis with current pathological fracture, left femur, initial encounter for fracture: Secondary | ICD-10-CM | POA: Diagnosis not present

## 2018-09-08 DIAGNOSIS — Y998 Other external cause status: Secondary | ICD-10-CM | POA: Diagnosis not present

## 2018-09-08 DIAGNOSIS — S72102A Unspecified trochanteric fracture of left femur, initial encounter for closed fracture: Secondary | ICD-10-CM | POA: Diagnosis not present

## 2018-09-08 DIAGNOSIS — I10 Essential (primary) hypertension: Secondary | ICD-10-CM | POA: Diagnosis not present

## 2018-09-08 DIAGNOSIS — Z4789 Encounter for other orthopedic aftercare: Secondary | ICD-10-CM | POA: Diagnosis not present

## 2018-09-08 DIAGNOSIS — E569 Vitamin deficiency, unspecified: Secondary | ICD-10-CM | POA: Diagnosis not present

## 2018-09-08 DIAGNOSIS — S82002D Unspecified fracture of left patella, subsequent encounter for closed fracture with routine healing: Secondary | ICD-10-CM | POA: Diagnosis not present

## 2018-09-08 DIAGNOSIS — Z79899 Other long term (current) drug therapy: Secondary | ICD-10-CM | POA: Diagnosis not present

## 2018-09-08 DIAGNOSIS — S7292XA Unspecified fracture of left femur, initial encounter for closed fracture: Secondary | ICD-10-CM | POA: Diagnosis not present

## 2018-09-08 DIAGNOSIS — S7222XA Displaced subtrochanteric fracture of left femur, initial encounter for closed fracture: Secondary | ICD-10-CM | POA: Diagnosis not present

## 2018-09-08 DIAGNOSIS — W010XXA Fall on same level from slipping, tripping and stumbling without subsequent striking against object, initial encounter: Secondary | ICD-10-CM | POA: Diagnosis not present

## 2018-09-08 DIAGNOSIS — Z743 Need for continuous supervision: Secondary | ICD-10-CM | POA: Diagnosis not present

## 2018-09-08 DIAGNOSIS — Z1159 Encounter for screening for other viral diseases: Secondary | ICD-10-CM | POA: Diagnosis not present

## 2018-09-08 DIAGNOSIS — Y9259 Other trade areas as the place of occurrence of the external cause: Secondary | ICD-10-CM | POA: Diagnosis not present

## 2018-09-08 DIAGNOSIS — E785 Hyperlipidemia, unspecified: Secondary | ICD-10-CM | POA: Diagnosis not present

## 2018-09-08 DIAGNOSIS — D62 Acute posthemorrhagic anemia: Secondary | ICD-10-CM | POA: Diagnosis not present

## 2018-09-08 DIAGNOSIS — R262 Difficulty in walking, not elsewhere classified: Secondary | ICD-10-CM | POA: Diagnosis not present

## 2018-09-15 DIAGNOSIS — Z743 Need for continuous supervision: Secondary | ICD-10-CM | POA: Diagnosis not present

## 2018-09-15 DIAGNOSIS — R52 Pain, unspecified: Secondary | ICD-10-CM | POA: Diagnosis not present

## 2018-09-15 DIAGNOSIS — E569 Vitamin deficiency, unspecified: Secondary | ICD-10-CM | POA: Diagnosis not present

## 2018-09-15 DIAGNOSIS — E785 Hyperlipidemia, unspecified: Secondary | ICD-10-CM | POA: Diagnosis not present

## 2018-09-15 DIAGNOSIS — R262 Difficulty in walking, not elsewhere classified: Secondary | ICD-10-CM | POA: Diagnosis not present

## 2018-09-15 DIAGNOSIS — R279 Unspecified lack of coordination: Secondary | ICD-10-CM | POA: Diagnosis not present

## 2018-09-15 DIAGNOSIS — I10 Essential (primary) hypertension: Secondary | ICD-10-CM | POA: Diagnosis not present

## 2018-09-15 DIAGNOSIS — S72002D Fracture of unspecified part of neck of left femur, subsequent encounter for closed fracture with routine healing: Secondary | ICD-10-CM | POA: Diagnosis not present

## 2018-09-15 DIAGNOSIS — M6281 Muscle weakness (generalized): Secondary | ICD-10-CM | POA: Diagnosis not present

## 2018-09-15 DIAGNOSIS — R5381 Other malaise: Secondary | ICD-10-CM | POA: Diagnosis not present

## 2018-09-15 DIAGNOSIS — D649 Anemia, unspecified: Secondary | ICD-10-CM | POA: Diagnosis not present

## 2018-09-15 DIAGNOSIS — R269 Unspecified abnormalities of gait and mobility: Secondary | ICD-10-CM | POA: Diagnosis not present

## 2018-09-15 DIAGNOSIS — Z4789 Encounter for other orthopedic aftercare: Secondary | ICD-10-CM | POA: Diagnosis not present

## 2018-09-18 DIAGNOSIS — I10 Essential (primary) hypertension: Secondary | ICD-10-CM | POA: Diagnosis not present

## 2018-09-18 DIAGNOSIS — E785 Hyperlipidemia, unspecified: Secondary | ICD-10-CM | POA: Diagnosis not present

## 2018-09-18 DIAGNOSIS — S72002D Fracture of unspecified part of neck of left femur, subsequent encounter for closed fracture with routine healing: Secondary | ICD-10-CM | POA: Diagnosis not present

## 2018-09-27 DIAGNOSIS — E785 Hyperlipidemia, unspecified: Secondary | ICD-10-CM | POA: Diagnosis not present

## 2018-09-27 DIAGNOSIS — S72002D Fracture of unspecified part of neck of left femur, subsequent encounter for closed fracture with routine healing: Secondary | ICD-10-CM | POA: Diagnosis not present

## 2018-09-27 DIAGNOSIS — I10 Essential (primary) hypertension: Secondary | ICD-10-CM | POA: Diagnosis not present

## 2018-10-10 ENCOUNTER — Other Ambulatory Visit: Payer: Self-pay

## 2018-10-10 ENCOUNTER — Encounter: Payer: Self-pay | Admitting: Family Medicine

## 2018-10-10 ENCOUNTER — Ambulatory Visit (INDEPENDENT_AMBULATORY_CARE_PROVIDER_SITE_OTHER): Payer: PPO | Admitting: Family Medicine

## 2018-10-10 VITALS — BP 132/82 | HR 68 | Temp 98.3°F | Wt 147.0 lb

## 2018-10-10 DIAGNOSIS — M810 Age-related osteoporosis without current pathological fracture: Secondary | ICD-10-CM | POA: Diagnosis not present

## 2018-10-10 DIAGNOSIS — Z96641 Presence of right artificial hip joint: Secondary | ICD-10-CM | POA: Diagnosis not present

## 2018-10-10 DIAGNOSIS — E782 Mixed hyperlipidemia: Secondary | ICD-10-CM

## 2018-10-10 DIAGNOSIS — Z8781 Personal history of (healed) traumatic fracture: Secondary | ICD-10-CM

## 2018-10-10 DIAGNOSIS — I10 Essential (primary) hypertension: Secondary | ICD-10-CM | POA: Diagnosis not present

## 2018-10-10 NOTE — Progress Notes (Signed)
Stacey [name]  MRN: [id number] DOB: 1945-12-08  Subjective:  HPI   The patient is a 73 year old female who presents for follow up.  She reports she fell, had fracture hip and then went to [facility] for rehabilitation. The patient had surgery in [facility] and does not have an Orthopedist in town.  She does not have continued physical therapy set up since she left [facility]. She will need referral to orthopedist for follow up.   Patient fractured hip on 09/08/18.  Patient Active Problem List   Diagnosis Date Noted  . Hypertension   . Osteoporosis   . Olecranon bursitis   . Hyperlipidemia   . Other shoulder lesions, right shoulder 03/08/2015  . Complete rotator cuff rupture of left shoulder 03/08/2015  . Intertrochanteric fracture of right femur ([place]) 02/04/2015   Past Medical History:  Diagnosis Date  . Hyperlipidemia   . Hypertension   . Olecranon bursitis    left   . Osteoporosis    Past Surgical History:  Procedure Laterality Date  . ABDOMINAL HYSTERECTOMY    . INTRAMEDULLARY (IM) NAIL INTERTROCHANTERIC Right 02/05/2015   Procedure: INTRAMEDULLARY (IM) NAIL INTERTROCHANTRIC;  Surgeon: Corky Mull, MD;  Location: [facility] ORS;  Service: Orthopedics;  Laterality: Right;   Family History  Problem Relation Age of Onset  . Cancer Mother        colon  . Cancer Father        lung cancer   Social History   Socioeconomic History  . Marital status: Married    Spouse name: Not on file  . Number of children: Not on file  . Years of education: Not on file  . Highest education level: Not on file  Occupational History  . Not on file  Social Needs  . Financial resource strain: Not on file  . Food insecurity    Worry: Not on file    Inability: Not on file  . Transportation needs    Medical: Not on file    Non-medical: Not on file  Tobacco Use  . Smoking status: Never Smoker  . Smokeless tobacco: Never Used  Substance and Sexual Activity  . Alcohol use: No  .  Drug use: No  . Sexual activity: Not Currently  Lifestyle  . Physical activity    Days per week: Not on file    Minutes per session: Not on file  . Stress: Not on file  Relationships  . Social Herbalist on phone: Not on file    Gets together: Not on file    Attends religious service: Not on file    Active member of club or organization: Not on file    Attends meetings of clubs or organizations: Not on file    Relationship status: Not on file  . Intimate partner violence    Fear of current or ex partner: Not on file    Emotionally abused: Not on file    Physically abused: Not on file    Forced sexual activity: Not on file  Other Topics Concern  . Not on file  Social History Narrative  . Not on file   Outpatient Encounter Medications as of 10/10/2018  Medication Sig  . acetaminophen (TYLENOL) 325 MG tablet Take 2 tablets (650 mg total) by mouth every 6 (six) hours as needed for mild pain (or Fever >/= 101).  . Calcium Citrate-Vitamin D (CALCIUM + D PO) Take 1 tablet by mouth daily.  Marland Kitchen  Cholecalciferol (VITAMIN D-3) 1000 UNITS CAPS Take 1 capsule by mouth daily.  . Coenzyme Q10 (COQ-10) 100 MG CAPS Take 100 mg by mouth daily.  Marland Kitchen GARLIC PO Take 3,893 mg by mouth daily.   . metoprolol succinate (TOPROL-XL) 100 MG 24 hr tablet TAKE 1/2 TABLET DAILY  . Omega-3 Fatty Acids (FISH OIL PO) Take 300 mg by mouth.   . pravastatin (PRAVACHOL) 20 MG tablet Take 1 tablet (20 mg total) by mouth daily.  . Red Yeast Rice Extract (RED YEAST RICE PO) Take 600 mg by mouth daily.   . Turmeric Curcumin 500 MG CAPS Take 500 mg by mouth daily.  . vitamin C (ASCORBIC ACID) 500 MG tablet Take 1,000 mg by mouth daily.    No facility-administered encounter medications on file as of 10/10/2018.    No Known Allergies  Review of Systems  Constitutional: Negative for fever.  Respiratory: Negative for cough, shortness of breath and wheezing.   Cardiovascular: Positive for leg swelling. Negative for  chest pain, palpitations, orthopnea and claudication.    Objective:  BP 132/82 (BP Location: Right Arm, Patient Position: Sitting, Cuff Size: Normal)   Pulse 68   Temp 98.3 F (36.8 C) (Oral)   Wt 147 lb (66.7 kg)   SpO2 97%   BMI 24.46 kg/m   Physical Exam  Constitutional: She is oriented to person, place, and time and well-developed, well-nourished, and in no distress.  HENT:  Head: Normocephalic.  Eyes: Conjunctivae are normal.  Neck: Neck supple.  Cardiovascular: Normal rate and regular rhythm.  Pulmonary/Chest: Effort normal.  Abdominal: Soft.  Musculoskeletal:     Comments: Stiffness in the left hip. Unable to put left ankle on the right knee. Some limping when walking with a rolling walker.  Neurological: She is alert and oriented to person, place, and time.  Skin: No rash noted.  Psychiatric: Mood, affect and judgment normal.    Assessment and Plan :   1. History of fracture of left hip [place] in a store on 09-08-18 fracturing the left hip. Had intramedullary nailing at [facility] by Dr. San Jetty (orthopedist) and sent to [facility]. Released 2 weeks ago and ready for home PT. Follow up pending reports. - CBC with Differential/Platelet - Ambulatory referral to Physical Therapy  2. History of right hip replacement Fractured the right intertrochanteric hip requiring intramedullary nailing by Dr. Roland Rack (orthopedist) on 02-05-15. No pain and walking with a walker now. - Ambulatory referral to Physical Therapy  3. Essential hypertension Well controlled on Garlic 7342 mg qd and Metoprolol Succinate 100 mg 1/2 tablet qd. Will get routine follow up labs. - CBC with Differential/Platelet - Comprehensive metabolic panel - TSH  4. Mixed hyperlipidemia Tolerating Pravastatin with Omega-3 Fish Oil 300 mg qd, Red Yeast Rice 600 mg qd and Co-Q 10 100 mg qd. Encouraged to follow low fat diet and recheck CMP with Lipid Panel. - Comprehensive metabolic panel  - Lipid panel  5. Age related osteoporosis, unspecified pathological fracture presence Presently taking calcium and vitamin D to help maintain bone density. Last BMD on 05-17-14 showed osteoporosis prior to right hip fracture and nail placement 02-05-15 by Dr. Roland Rack (orthopedist). Will schedule follow up labs and PT. - CBC with Differential/Platelet - Comprehensive metabolic panel - Ambulatory referral to Physical Therapy

## 2018-10-21 DIAGNOSIS — R2689 Other abnormalities of gait and mobility: Secondary | ICD-10-CM | POA: Diagnosis not present

## 2018-10-21 DIAGNOSIS — M25552 Pain in left hip: Secondary | ICD-10-CM | POA: Diagnosis not present

## 2018-10-21 DIAGNOSIS — R2681 Unsteadiness on feet: Secondary | ICD-10-CM | POA: Diagnosis not present

## 2018-10-24 DIAGNOSIS — R2681 Unsteadiness on feet: Secondary | ICD-10-CM | POA: Diagnosis not present

## 2018-10-24 DIAGNOSIS — R2689 Other abnormalities of gait and mobility: Secondary | ICD-10-CM | POA: Diagnosis not present

## 2018-10-24 DIAGNOSIS — M25552 Pain in left hip: Secondary | ICD-10-CM | POA: Diagnosis not present

## 2018-10-27 DIAGNOSIS — R2681 Unsteadiness on feet: Secondary | ICD-10-CM | POA: Diagnosis not present

## 2018-10-27 DIAGNOSIS — M25552 Pain in left hip: Secondary | ICD-10-CM | POA: Diagnosis not present

## 2018-10-27 DIAGNOSIS — R2689 Other abnormalities of gait and mobility: Secondary | ICD-10-CM | POA: Diagnosis not present

## 2018-10-31 DIAGNOSIS — M25552 Pain in left hip: Secondary | ICD-10-CM | POA: Diagnosis not present

## 2018-10-31 DIAGNOSIS — R2681 Unsteadiness on feet: Secondary | ICD-10-CM | POA: Diagnosis not present

## 2018-10-31 DIAGNOSIS — R2689 Other abnormalities of gait and mobility: Secondary | ICD-10-CM | POA: Diagnosis not present

## 2018-11-03 DIAGNOSIS — R2689 Other abnormalities of gait and mobility: Secondary | ICD-10-CM | POA: Diagnosis not present

## 2018-11-03 DIAGNOSIS — R2681 Unsteadiness on feet: Secondary | ICD-10-CM | POA: Diagnosis not present

## 2018-11-03 DIAGNOSIS — M25552 Pain in left hip: Secondary | ICD-10-CM | POA: Diagnosis not present

## 2018-11-07 DIAGNOSIS — Z8781 Personal history of (healed) traumatic fracture: Secondary | ICD-10-CM | POA: Diagnosis not present

## 2018-11-07 DIAGNOSIS — I10 Essential (primary) hypertension: Secondary | ICD-10-CM | POA: Diagnosis not present

## 2018-11-07 DIAGNOSIS — E782 Mixed hyperlipidemia: Secondary | ICD-10-CM | POA: Diagnosis not present

## 2018-11-07 DIAGNOSIS — M810 Age-related osteoporosis without current pathological fracture: Secondary | ICD-10-CM | POA: Diagnosis not present

## 2018-11-08 DIAGNOSIS — R2689 Other abnormalities of gait and mobility: Secondary | ICD-10-CM | POA: Diagnosis not present

## 2018-11-08 DIAGNOSIS — M25552 Pain in left hip: Secondary | ICD-10-CM | POA: Diagnosis not present

## 2018-11-08 DIAGNOSIS — R2681 Unsteadiness on feet: Secondary | ICD-10-CM | POA: Diagnosis not present

## 2018-11-08 LAB — CBC WITH DIFFERENTIAL/PLATELET
Basophils Absolute: 0.1 10*3/uL (ref 0.0–0.2)
Basos: 1 %
EOS (ABSOLUTE): 0.2 10*3/uL (ref 0.0–0.4)
Eos: 2 %
Hematocrit: 41 % (ref 34.0–46.6)
Hemoglobin: 13.5 g/dL (ref 11.1–15.9)
Immature Grans (Abs): 0 10*3/uL (ref 0.0–0.1)
Immature Granulocytes: 0 %
Lymphocytes Absolute: 5.5 10*3/uL — ABNORMAL HIGH (ref 0.7–3.1)
Lymphs: 55 %
MCH: 29.8 pg (ref 26.6–33.0)
MCHC: 32.9 g/dL (ref 31.5–35.7)
MCV: 91 fL (ref 79–97)
Monocytes Absolute: 0.4 10*3/uL (ref 0.1–0.9)
Monocytes: 4 %
Neutrophils Absolute: 3.7 10*3/uL (ref 1.4–7.0)
Neutrophils: 38 %
Platelets: 204 10*3/uL (ref 150–450)
RBC: 4.53 x10E6/uL (ref 3.77–5.28)
RDW: 12.3 % (ref 11.7–15.4)
WBC: 9.8 10*3/uL (ref 3.4–10.8)

## 2018-11-08 LAB — LIPID PANEL
Chol/HDL Ratio: 3.7 ratio (ref 0.0–4.4)
Cholesterol, Total: 179 mg/dL (ref 100–199)
HDL: 49 mg/dL (ref >39)
LDL Calculated: 106 mg/dL — ABNORMAL HIGH (ref 0–99)
Triglycerides: 121 mg/dL (ref 0–149)
VLDL Cholesterol Cal: 24 mg/dL (ref 5–40)

## 2018-11-08 LAB — COMPREHENSIVE METABOLIC PANEL
ALT: 11 IU/L (ref 0–32)
AST: 18 IU/L (ref 0–40)
Albumin/Globulin Ratio: 2.2 (ref 1.2–2.2)
Albumin: 4.2 g/dL (ref 3.7–4.7)
Alkaline Phosphatase: 123 IU/L — ABNORMAL HIGH (ref 39–117)
BUN/Creatinine Ratio: 13 (ref 12–28)
BUN: 13 mg/dL (ref 8–27)
Bilirubin Total: 0.6 mg/dL (ref 0.0–1.2)
CO2: 21 mmol/L (ref 20–29)
Calcium: 9.9 mg/dL (ref 8.7–10.3)
Chloride: 106 mmol/L (ref 96–106)
Creatinine, Ser: 1.01 mg/dL — ABNORMAL HIGH (ref 0.57–1.00)
GFR calc Af Amer: 63 mL/min/{1.73_m2} (ref >59)
GFR calc non Af Amer: 55 mL/min/{1.73_m2} — ABNORMAL LOW (ref >59)
Globulin, Total: 1.9 g/dL (ref 1.5–4.5)
Glucose: 93 mg/dL (ref 65–99)
Potassium: 3.8 mmol/L (ref 3.5–5.2)
Sodium: 141 mmol/L (ref 134–144)
Total Protein: 6.1 g/dL (ref 6.0–8.5)

## 2018-11-08 LAB — TSH: TSH: 2.73 u[IU]/mL (ref 0.450–4.500)

## 2018-11-09 ENCOUNTER — Telehealth: Payer: Self-pay

## 2018-11-09 NOTE — Telephone Encounter (Signed)
Patient notified of lab results, follow up appointment scheduled.

## 2018-11-09 NOTE — Telephone Encounter (Signed)
-----   Message from [place]o [place], [place] sent at 11/08/2018  1:45 PM EDT ----- All labs essentially normal with some mild variations. Continue present medications and recheck in 4-6 months.

## 2018-11-09 NOTE — Telephone Encounter (Signed)
Left message for patient to call back regarding labs. 

## 2018-11-10 DIAGNOSIS — R2681 Unsteadiness on feet: Secondary | ICD-10-CM | POA: Diagnosis not present

## 2018-11-10 DIAGNOSIS — M25552 Pain in left hip: Secondary | ICD-10-CM | POA: Diagnosis not present

## 2018-11-10 DIAGNOSIS — R2689 Other abnormalities of gait and mobility: Secondary | ICD-10-CM | POA: Diagnosis not present

## 2018-11-15 DIAGNOSIS — R2689 Other abnormalities of gait and mobility: Secondary | ICD-10-CM | POA: Diagnosis not present

## 2018-11-15 DIAGNOSIS — R2681 Unsteadiness on feet: Secondary | ICD-10-CM | POA: Diagnosis not present

## 2018-11-15 DIAGNOSIS — M25552 Pain in left hip: Secondary | ICD-10-CM | POA: Diagnosis not present

## 2018-11-17 DIAGNOSIS — R2689 Other abnormalities of gait and mobility: Secondary | ICD-10-CM | POA: Diagnosis not present

## 2018-11-17 DIAGNOSIS — R2681 Unsteadiness on feet: Secondary | ICD-10-CM | POA: Diagnosis not present

## 2018-11-17 DIAGNOSIS — M25552 Pain in left hip: Secondary | ICD-10-CM | POA: Diagnosis not present

## 2018-11-22 ENCOUNTER — Other Ambulatory Visit: Payer: Self-pay

## 2018-11-22 DIAGNOSIS — E782 Mixed hyperlipidemia: Secondary | ICD-10-CM

## 2018-11-22 MED ORDER — PRAVASTATIN SODIUM 20 MG PO TABS: 20.0000 mg | ORAL_TABLET | Freq: Every day | ORAL | 3 refills | Status: DC

## 2018-11-22 NOTE — Telephone Encounter (Signed)
Patient called requesting a refill on her Pravastatin 20 MG. L.O.V. 10/10/2018, please advise.

## 2018-12-01 DIAGNOSIS — M810 Age-related osteoporosis without current pathological fracture: Secondary | ICD-10-CM | POA: Diagnosis not present

## 2018-12-01 DIAGNOSIS — S72141D Displaced intertrochanteric fracture of right femur, subsequent encounter for closed fracture with routine healing: Secondary | ICD-10-CM | POA: Diagnosis not present

## 2019-02-07 ENCOUNTER — Other Ambulatory Visit: Payer: Self-pay | Admitting: Family Medicine

## 2019-02-22 ENCOUNTER — Other Ambulatory Visit: Payer: Self-pay

## 2019-04-04 ENCOUNTER — Ambulatory Visit: Payer: Self-pay | Admitting: Family Medicine

## 2019-08-02 ENCOUNTER — Telehealth: Payer: Self-pay

## 2019-08-02 NOTE — Telephone Encounter (Signed)
Copied from [place] [phone]. Topic: General - Other >> Aug 02, 2019 10:57 AM Keene Breath wrote: Reason for CRM: Patient's daughter would like to talk with the doctor or nurse regarding patient having dementia.  CB# [phone] Sheria Lang)

## 2019-08-21 ENCOUNTER — Ambulatory Visit (INDEPENDENT_AMBULATORY_CARE_PROVIDER_SITE_OTHER): Payer: PPO | Admitting: Family Medicine

## 2019-08-21 ENCOUNTER — Other Ambulatory Visit: Payer: Self-pay

## 2019-08-21 ENCOUNTER — Encounter: Payer: Self-pay | Admitting: Family Medicine

## 2019-08-21 VITALS — BP 130/82 | HR 63 | Temp 97.3°F | Wt 147.0 lb

## 2019-08-21 DIAGNOSIS — I10 Essential (primary) hypertension: Secondary | ICD-10-CM

## 2019-08-21 DIAGNOSIS — Z1211 Encounter for screening for malignant neoplasm of colon: Secondary | ICD-10-CM | POA: Diagnosis not present

## 2019-08-21 DIAGNOSIS — R413 Other amnesia: Secondary | ICD-10-CM

## 2019-08-21 DIAGNOSIS — M810 Age-related osteoporosis without current pathological fracture: Secondary | ICD-10-CM

## 2019-08-21 DIAGNOSIS — Z1231 Encounter for screening mammogram for malignant neoplasm of breast: Secondary | ICD-10-CM | POA: Diagnosis not present

## 2019-08-21 DIAGNOSIS — E782 Mixed hyperlipidemia: Secondary | ICD-10-CM

## 2019-08-21 MED ORDER — DONEPEZIL HCL 5 MG PO TABS: 5.0000 mg | ORAL_TABLET | Freq: Every day | ORAL | 3 refills | Status: DC

## 2019-08-21 NOTE — Progress Notes (Signed)
Established patient visit   I,Elena D DeSanto,acting as a scribe for [employer], PA.,have documented all relevant documentation on the behalf of Vernie Murders, PA,as directed by  [employer], PA while in the presence of [employer], [place].   Patient: Stacey [name]   DOB: 22-Dec-1945   74 y.o. Female  MRN: [id number] Visit Date: 08/21/2019  Today's healthcare provider: Vernie Murders, PA   Chief Complaint  Patient presents with  . Memory Loss   Subjective    HPI  Cognition- The patient is a 16 tear old female who presents today for evaluation of her cognitive status. She was last seen in the office on 10/10/18.  At that time she was post hip fracture and hip replacement in 2016.   She had surgery by Dr Roland Rack at [facility].   She was referred to Physical Therapy during that visit.  Patient reports that she completed therapy and is doing well from that surgery.  The patient and her daughter provide the information for today.  They report that the patient has been noted to be repeating things that she says to them several times in the same conversation.  They report she had just a couple of occasions where she would get confused where she was driving.  The daughter states these were areas that should have been familiar to her.  She was able to then correct herself and get home without having to seek help.  Note: Patient is due for Colon Cancer screening and Breast Cancer Screening   Medications: Outpatient Medications Prior to Visit  Medication Sig  . acetaminophen (TYLENOL) 325 MG tablet Take 2 tablets (650 mg total) by mouth every 6 (six) hours as needed for mild pain (or Fever >/= 101).  . Calcium Citrate-Vitamin D (CALCIUM + D PO) Take 1 tablet by mouth daily.  . Cholecalciferol (VITAMIN D-3) 1000 UNITS CAPS Take 1 capsule by mouth daily.  . Coenzyme Q10 (COQ-10) 100 MG CAPS Take 100 mg by mouth daily.  Marland Kitchen GARLIC PO Take [id number] mg by mouth daily.   .  metoprolol succinate (TOPROL-XL) 100 MG 24 hr tablet TAKE ONE-HALF TABLET BY MOUTH DAILY  . Omega-3 Fatty Acids (FISH OIL PO) Take 300 mg by mouth.   . pravastatin (PRAVACHOL) 20 MG tablet Take 1 tablet (20 mg total) by mouth daily.  . Red Yeast Rice Extract (RED YEAST RICE PO) Take 600 mg by mouth daily.   . Turmeric Curcumin 500 MG CAPS Take 500 mg by mouth daily.  . vitamin C (ASCORBIC ACID) 500 MG tablet Take 1,000 mg by mouth daily.    No facility-administered medications prior to visit.    Review of Systems  Psychiatric/Behavioral: Positive for confusion (patient states no but daughter states yes). Negative for agitation, behavioral problems, decreased concentration, dysphoric mood, hallucinations, self-injury, sleep disturbance and suicidal ideas. The patient is not nervous/anxious and is not hyperactive.        More emotional      Objective    BP 130/82 (BP Location: Right Arm, Patient Position: Sitting, Cuff Size: Normal)   Pulse 63   Temp (!) 97.3 F (36.3 C) (Skin)   Wt 147 lb (66.7 kg)   SpO2 97%   BMI 24.46 kg/m    Physical Exam Constitutional:      Appearance: She is well-developed.  HENT:     Head: Normocephalic and atraumatic.     Right Ear: External ear normal.     Left  Ear: External ear normal.     Nose: Nose normal.  Eyes:     General:        Right eye: No discharge.     Conjunctiva/sclera: Conjunctivae normal.     Pupils: Pupils are equal, round, and reactive to light.  Neck:     Thyroid: No thyromegaly.     Trachea: No tracheal deviation.  Cardiovascular:     Rate and Rhythm: Normal rate and regular rhythm.     Heart sounds: Normal heart sounds. No murmur.  Pulmonary:     Effort: Pulmonary effort is normal. No respiratory distress.     Breath sounds: Normal breath sounds. No wheezing or rales.  Chest:     Chest wall: No tenderness.  Abdominal:     General: There is no distension.     Palpations: Abdomen is soft. There is no mass.      Tenderness: There is no abdominal tenderness. There is no guarding or rebound.  Musculoskeletal:        General: No tenderness. Normal range of motion.     Cervical back: Normal range of motion and neck supple.  Lymphadenopathy:     Cervical: No cervical adenopathy.  Skin:    General: Skin is warm and dry.     Findings: No erythema or rash.  Neurological:     Mental Status: She is alert and oriented to person, place, and time.     Cranial Nerves: No cranial nerve deficit.     Motor: No abnormal muscle tone.     Coordination: Coordination normal.     Deep Tendon Reflexes: Reflexes are normal and symmetric. Reflexes normal.     Comments: Early memory deficits with 25/30 MMSE score.  Psychiatric:        Behavior: Behavior normal.        Thought Content: Thought content normal.        Judgment: Judgment normal.      Depression screen [facility] 2/9 11/10/2016 10/29/2016  Decreased Interest 0 0  Down, Depressed, Hopeless 0 0  PHQ - 2 Score 0 0   Fall Risk  02/22/2019 01/06/2018 11/10/2016 10/29/2016  Falls in the past year? 1 No No Yes  Comment Emmi Telephone Survey: data to providers prior to load - - -  Number falls in past yr: 1 - - 1  Comment Emmi Telephone Survey Actual Response = 1 - - -  Injury with Fall? 1 - - Yes     Functional Status Survey:       MMSE - Mini Mental State Exam 08/21/2019  Orientation to time 4  Orientation to Place 5  Registration 3  Attention/ Calculation 5  Recall 0  Language- name 2 objects 2  Language- repeat 1  Language- follow 3 step command 2  Language- read & follow direction 1  Write a sentence 1  Copy design 1  Total score 25    No results found for any visits on 08/21/19.  Assessment & Plan     1. Memory deficits Family feels she is starting to have more issues with memory. Recommend she not drive at night. MMSE score 25/30 today. Will start Aricept and schedule follow up with neurologist. Check labs and follow up pending reports. - CBC  with Differential/Platelet - Comprehensive metabolic panel - TSH - VITAMIN D 25 Hydroxy (Vit-D Deficiency, Fractures) - Ambulatory referral to Neurology - donepezil (ARICEPT) 5 MG tablet; Take 1 tablet (5 mg total) by mouth at bedtime.  Dispense: 30 tablet; Refill: 3  2. Essential hypertension Well controlled without chest pains, palpitations or peripheral edema. Tolerating Metoprolol Succinate 50 mg qd. Recheck follow up labs. - CBC with Differential/Platelet - Comprehensive metabolic panel - Lipid Panel With LDL/HDL Ratio - TSH  3. Mixed hyperlipidemia Tolerating the Pravastatin 20 mg qd and trying to follow a low fat diet. Recheck CMP, TSH and Lipid Panel. - Comprehensive metabolic panel - Lipid Panel With LDL/HDL Ratio - TSH  4. Age-related osteoporosis without current pathological fracture History of fractures in both hips and Vitamin D deficiency. Recheck labs and continue calcium with vitamin-D supplements.. - Comprehensive metabolic panel - VITAMIN D 25 Hydroxy (Vit-D Deficiency, Fractures)  5. Encounter for screening mammogram for malignant neoplasm of breast Denies masses or nipple discharge. Mother had a history of breast cancer. - MM 3D SCREEN BREAST BILATERAL  6. Screening for colon cancer Family history positive for colon cancer in her mother. Patient asymptomatic today. - Cologuard   No follow-ups on file.     Andres Shad, PA, have reviewed all documentation for this visit. The documentation on 08/21/19 for the exam, diagnosis, procedures, and orders are all accurate and complete.     Vernie Murders, [place] [phone] (phone) [phone] (fax)  [place]

## 2019-08-22 ENCOUNTER — Telehealth: Payer: Self-pay

## 2019-08-22 LAB — CBC WITH DIFFERENTIAL/PLATELET
Basophils Absolute: 0.1 10*3/uL (ref 0.0–0.2)
Basos: 0 %
EOS (ABSOLUTE): 0.1 10*3/uL (ref 0.0–0.4)
Eos: 1 %
Hematocrit: 44.2 % (ref 34.0–46.6)
Hemoglobin: 14.4 g/dL (ref 11.1–15.9)
Immature Grans (Abs): 0 10*3/uL (ref 0.0–0.1)
Immature Granulocytes: 0 %
Lymphocytes Absolute: 5.5 10*3/uL — ABNORMAL HIGH (ref 0.7–3.1)
Lymphs: 50 %
MCH: 30.1 pg (ref 26.6–33.0)
MCHC: 32.6 g/dL (ref 31.5–35.7)
MCV: 93 fL (ref 79–97)
Monocytes Absolute: 0.7 10*3/uL (ref 0.1–0.9)
Monocytes: 6 %
Neutrophils Absolute: 4.8 10*3/uL (ref 1.4–7.0)
Neutrophils: 43 %
Platelets: 185 10*3/uL (ref 150–450)
RBC: 4.78 x10E6/uL (ref 3.77–5.28)
RDW: 12.9 % (ref 11.7–15.4)
WBC: 11.1 10*3/uL — ABNORMAL HIGH (ref 3.4–10.8)

## 2019-08-22 LAB — VITAMIN D 25 HYDROXY (VIT D DEFICIENCY, FRACTURES): Vit D, 25-Hydroxy: 68.5 ng/mL (ref 30.0–100.0)

## 2019-08-22 LAB — COMPREHENSIVE METABOLIC PANEL
ALT: 10 IU/L (ref 0–32)
AST: 17 IU/L (ref 0–40)
Albumin/Globulin Ratio: 2.2 (ref 1.2–2.2)
Albumin: 4.3 g/dL (ref 3.7–4.7)
Alkaline Phosphatase: 92 IU/L (ref 48–121)
BUN/Creatinine Ratio: 17 (ref 12–28)
BUN: 21 mg/dL (ref 8–27)
Bilirubin Total: 0.7 mg/dL (ref 0.0–1.2)
CO2: 21 mmol/L (ref 20–29)
Calcium: 10.4 mg/dL — ABNORMAL HIGH (ref 8.7–10.3)
Chloride: 107 mmol/L — ABNORMAL HIGH (ref 96–106)
Creatinine, Ser: 1.24 mg/dL — ABNORMAL HIGH (ref 0.57–1.00)
GFR calc Af Amer: 49 mL/min/{1.73_m2} — ABNORMAL LOW (ref >59)
GFR calc non Af Amer: 43 mL/min/{1.73_m2} — ABNORMAL LOW (ref >59)
Globulin, Total: 2 g/dL (ref 1.5–4.5)
Glucose: 83 mg/dL (ref 65–99)
Potassium: 3.8 mmol/L (ref 3.5–5.2)
Sodium: 145 mmol/L — ABNORMAL HIGH (ref 134–144)
Total Protein: 6.3 g/dL (ref 6.0–8.5)

## 2019-08-22 LAB — TSH: TSH: 2.51 u[IU]/mL (ref 0.450–4.500)

## 2019-08-22 LAB — LIPID PANEL WITH LDL/HDL RATIO
Cholesterol, Total: 183 mg/dL (ref 100–199)
HDL: 55 mg/dL (ref >39)
LDL Chol Calc (NIH): 110 mg/dL — ABNORMAL HIGH (ref 0–99)
LDL/HDL Ratio: 2 ratio (ref 0.0–3.2)
Triglycerides: 101 mg/dL (ref 0–149)
VLDL Cholesterol Cal: 18 mg/dL (ref 5–40)

## 2019-08-22 NOTE — Telephone Encounter (Signed)
LMTCB, [facility] Triage Nurse may give patient results  

## 2019-08-22 NOTE — Telephone Encounter (Signed)
-----   Message from [place], [place] sent at 08/22/2019  9:20 AM EDT ----- Labs essentially normal except sodium chloride ("salt") level is elevated and kidney function shows decrease. This is probably due to need for water in diet to lower salt and improve kidney function. Recheck levels in 2-3 months and proceed with neurology evaluation.

## 2019-08-23 NOTE — Telephone Encounter (Signed)
Attempted to contact pt; left message on voicemail. 

## 2019-08-29 NOTE — Telephone Encounter (Signed)
LMTCB, [facility] Triage Nurse may give patient results  

## 2019-08-29 NOTE — Telephone Encounter (Signed)
Attempted to reach pt.;  "Call cannot be completed at this time."

## 2019-08-30 NOTE — Telephone Encounter (Signed)
Attempted to call pt.  Left vm to return call to office to discuss recent lab results.

## 2019-09-01 ENCOUNTER — Other Ambulatory Visit: Payer: Self-pay

## 2019-09-01 DIAGNOSIS — N289 Disorder of kidney and ureter, unspecified: Secondary | ICD-10-CM

## 2019-09-01 NOTE — Telephone Encounter (Signed)
Pt. Returned call. Given results and instructions. Will come to lab in 2-3 months for recheck as instructed. Please place lab orders.

## 2019-10-24 ENCOUNTER — Other Ambulatory Visit: Payer: Self-pay | Admitting: Neurology

## 2019-10-24 ENCOUNTER — Other Ambulatory Visit (HOSPITAL_COMMUNITY): Payer: Self-pay | Admitting: Neurology

## 2019-10-24 DIAGNOSIS — G939 Disorder of brain, unspecified: Secondary | ICD-10-CM | POA: Diagnosis not present

## 2019-10-24 DIAGNOSIS — E538 Deficiency of other specified B group vitamins: Secondary | ICD-10-CM | POA: Diagnosis not present

## 2019-10-24 DIAGNOSIS — H9193 Unspecified hearing loss, bilateral: Secondary | ICD-10-CM | POA: Diagnosis not present

## 2019-10-24 DIAGNOSIS — F0280 Dementia in other diseases classified elsewhere without behavioral disturbance: Secondary | ICD-10-CM | POA: Diagnosis not present

## 2019-10-24 DIAGNOSIS — F0150 Vascular dementia without behavioral disturbance: Secondary | ICD-10-CM | POA: Diagnosis not present

## 2019-10-24 DIAGNOSIS — E519 Thiamine deficiency, unspecified: Secondary | ICD-10-CM | POA: Diagnosis not present

## 2019-10-24 DIAGNOSIS — R4189 Other symptoms and signs involving cognitive functions and awareness: Secondary | ICD-10-CM | POA: Diagnosis not present

## 2019-10-24 DIAGNOSIS — G309 Alzheimer's disease, unspecified: Secondary | ICD-10-CM | POA: Diagnosis not present

## 2019-11-12 ENCOUNTER — Ambulatory Visit (HOSPITAL_COMMUNITY): Payer: PPO | Attending: Neurology

## 2019-11-12 ENCOUNTER — Encounter (HOSPITAL_COMMUNITY): Payer: Self-pay

## 2019-11-12 DIAGNOSIS — F0150 Vascular dementia without behavioral disturbance: Secondary | ICD-10-CM | POA: Insufficient documentation

## 2019-11-12 DIAGNOSIS — F0280 Dementia in other diseases classified elsewhere without behavioral disturbance: Secondary | ICD-10-CM | POA: Insufficient documentation

## 2019-12-13 ENCOUNTER — Other Ambulatory Visit: Payer: Self-pay | Admitting: Family Medicine

## 2019-12-13 DIAGNOSIS — E782 Mixed hyperlipidemia: Secondary | ICD-10-CM

## 2019-12-13 NOTE — Telephone Encounter (Signed)
Requested Prescriptions  Pending Prescriptions Disp Refills  . pravastatin (PRAVACHOL) 20 MG tablet [Pharmacy Med Name: PRAVASTATIN SODIUM 20 MG TAB] 90 tablet 3    Sig: TAKE ONE TABLET BY MOUTH DAILY     Cardiovascular:  Antilipid - Statins Failed - 12/13/2019  6:20 AM      Failed - LDL in normal range and within 360 days    LDL Chol Calc (NIH)  Date Value Ref Range Status  08/21/2019 110 (H) 0 - 99 mg/dL Final         Passed - Total Cholesterol in normal range and within 360 days    Cholesterol, Total  Date Value Ref Range Status  08/21/2019 183 100 - 199 mg/dL Final         Passed - HDL in normal range and within 360 days    HDL  Date Value Ref Range Status  08/21/2019 55 >39 mg/dL Final         Passed - Triglycerides in normal range and within 360 days    Triglycerides  Date Value Ref Range Status  08/21/2019 101 0 - 149 mg/dL Final         Passed - Patient is not pregnant      Passed - Valid encounter within last 12 months    Recent Outpatient Visits          3 months ago Memory deficits   [facility], [place], [place]   1 year ago History of fracture of left hip   [facility], [place], [place]   1 year ago Essential hypertension   [facility], [place], [place]   3 years ago Essential hypertension   [facility], [place], [place]   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

## 2019-12-18 ENCOUNTER — Other Ambulatory Visit: Payer: Self-pay

## 2019-12-18 ENCOUNTER — Ambulatory Visit (HOSPITAL_COMMUNITY)
Admission: RE | Admit: 2019-12-18 | Discharge: 2019-12-18 | Disposition: A | Discharge status: Home / Self Care | Payer: PPO | Source: Ambulatory Visit | Attending: Neurology | Admitting: Neurology

## 2019-12-18 DIAGNOSIS — J3489 Other specified disorders of nose and nasal sinuses: Secondary | ICD-10-CM | POA: Diagnosis not present

## 2019-12-18 DIAGNOSIS — I6782 Cerebral ischemia: Secondary | ICD-10-CM | POA: Diagnosis not present

## 2019-12-18 DIAGNOSIS — G9389 Other specified disorders of brain: Secondary | ICD-10-CM | POA: Diagnosis not present

## 2019-12-18 DIAGNOSIS — R4189 Other symptoms and signs involving cognitive functions and awareness: Secondary | ICD-10-CM | POA: Diagnosis not present

## 2019-12-18 DIAGNOSIS — G3184 Mild cognitive impairment, so stated: Secondary | ICD-10-CM | POA: Diagnosis not present

## 2019-12-28 DIAGNOSIS — G309 Alzheimer's disease, unspecified: Secondary | ICD-10-CM | POA: Diagnosis not present

## 2019-12-28 DIAGNOSIS — F0280 Dementia in other diseases classified elsewhere without behavioral disturbance: Secondary | ICD-10-CM | POA: Diagnosis not present

## 2019-12-28 DIAGNOSIS — H9193 Unspecified hearing loss, bilateral: Secondary | ICD-10-CM | POA: Diagnosis not present

## 2019-12-28 DIAGNOSIS — F0150 Vascular dementia without behavioral disturbance: Secondary | ICD-10-CM | POA: Diagnosis not present

## 2020-01-13 ENCOUNTER — Other Ambulatory Visit: Payer: Self-pay | Admitting: Family Medicine

## 2020-01-13 DIAGNOSIS — R413 Other amnesia: Secondary | ICD-10-CM

## 2020-02-07 DIAGNOSIS — F0150 Vascular dementia without behavioral disturbance: Secondary | ICD-10-CM | POA: Diagnosis not present

## 2020-02-07 DIAGNOSIS — G309 Alzheimer's disease, unspecified: Secondary | ICD-10-CM | POA: Diagnosis not present

## 2020-02-07 DIAGNOSIS — E559 Vitamin D deficiency, unspecified: Secondary | ICD-10-CM | POA: Diagnosis not present

## 2020-02-07 DIAGNOSIS — F0280 Dementia in other diseases classified elsewhere without behavioral disturbance: Secondary | ICD-10-CM | POA: Diagnosis not present

## 2020-02-07 DIAGNOSIS — H9193 Unspecified hearing loss, bilateral: Secondary | ICD-10-CM | POA: Diagnosis not present

## 2020-02-07 DIAGNOSIS — E538 Deficiency of other specified B group vitamins: Secondary | ICD-10-CM | POA: Diagnosis not present

## 2020-03-13 ENCOUNTER — Other Ambulatory Visit: Payer: Self-pay | Admitting: Family Medicine

## 2020-03-18 ENCOUNTER — Telehealth: Payer: Self-pay | Admitting: Family Medicine

## 2020-03-18 NOTE — Telephone Encounter (Signed)
Copied from [place] [phone]. Topic: Medicare AWV >> Mar 18, 2020  2:14 PM Cher Nakai R wrote: Reason for CRM:   No answer unable to leave a message for patient to call back and schedule Medicare Annual Wellness Visit (AWV) in office.   If not able to come in office, please offer to do virtually.   Last AWV 11/10/2016  Please schedule at anytime with [facility] Health Advisor.  If any questions, please contact me at [phone]

## 2020-04-23 ENCOUNTER — Ambulatory Visit: Payer: Self-pay

## 2020-04-23 NOTE — Telephone Encounter (Signed)
Patient called stating that she tripped and fell in her living room and hit her left forehead just above her eyebrow. She states it happened about 1 hour ago. She states that she see redness to the area and a red streak along the side of her nose.  She states the area is sore to touch.  She denies passing out.  She states when it happened she just scooted to some furniture and lifter herself off the floor. She denies hitting her nose or teeth. Her nose did not bleed. She has not been sick or anything that would cause weakness.  She was exposed to COVID-19 weeks ago but per patient never developed symptoms. DT was completed. OV scheduled for tomorrow. Care advice read to patient She verbalized understanding of all information.  Reason for Disposition . MILD weakness (i.e., does not interfere with ability to work, go to school, normal activities) (Exception: mild weakness is a chronic symptom)  Answer Assessment - Initial Assessment Questions 1. MECHANISM: "How did the fall happen?"     About 1 hour ago 2. DOMESTIC VIOLENCE AND ELDER ABUSE SCREENING: "Did you fall because someone pushed you or tried to hurt you?" If Yes, ask: "Are you safe now?"     no 3. ONSET: "When did the fall happen?" (e.g., minutes, hours, or days ago)     Hour ago 4. LOCATION: "What part of the body hit the ground?" (e.g., back, buttocks, head, hips, knees, hands, head, stomach)     Head front left  5. INJURY: "Did you hurt (injure) yourself when you fell?" If Yes, ask: "What did you injure? Tell me more about this?" (e.g., body area; type of injury; pain severity)"    no 6. PAIN: "Is there any pain?" If Yes, ask: "How bad is the pain?" (e.g., Scale 1-10; or mild,  moderate, severe)   - NONE (0): no pain   - MILD (1-3): doesn't interfere with normal activities    - MODERATE (4-7): interferes with normal activities or awakens from sleep    - SEVERE (8-10): excruciating pain, unable to do any normal activities      Above  left eye brow sore 1-3 mild only feels when touching 7. SIZE: For cuts, bruises, or swelling, ask: "How large is it?" (e.g., inches or centimeters)     no 8. PREGNANCY: "Is there any chance you are pregnant?" "When was your last menstrual period?"     N/A 9. OTHER SYMPTOMS: "Do you have any other symptoms?" (e.g., dizziness, fever, weakness; new onset or worsening).     none 10. CAUSE: "What do you think caused the fall (or falling)?" (e.g., tripped, dizzy spell)      tripped  Protocols used: FALLS AND FALLING-A-AH

## 2020-04-24 ENCOUNTER — Other Ambulatory Visit: Payer: Self-pay

## 2020-04-24 ENCOUNTER — Ambulatory Visit (INDEPENDENT_AMBULATORY_CARE_PROVIDER_SITE_OTHER): Payer: PPO | Admitting: Physician Assistant

## 2020-04-24 ENCOUNTER — Encounter: Payer: Self-pay | Admitting: Physician Assistant

## 2020-04-24 ENCOUNTER — Other Ambulatory Visit: Payer: Self-pay | Admitting: Family Medicine

## 2020-04-24 VITALS — BP 123/75 | HR 60 | Temp 98.3°F | Wt 139.1 lb

## 2020-04-24 DIAGNOSIS — Z1231 Encounter for screening mammogram for malignant neoplasm of breast: Secondary | ICD-10-CM

## 2020-04-24 DIAGNOSIS — W19XXXA Unspecified fall, initial encounter: Secondary | ICD-10-CM | POA: Diagnosis not present

## 2020-04-24 DIAGNOSIS — Z23 Encounter for immunization: Secondary | ICD-10-CM

## 2020-04-24 DIAGNOSIS — Z0000 Encounter for general adult medical examination without abnormal findings: Secondary | ICD-10-CM

## 2020-04-24 DIAGNOSIS — M810 Age-related osteoporosis without current pathological fracture: Secondary | ICD-10-CM

## 2020-04-24 DIAGNOSIS — I10 Essential (primary) hypertension: Secondary | ICD-10-CM

## 2020-04-24 DIAGNOSIS — S0181XA Laceration without foreign body of other part of head, initial encounter: Secondary | ICD-10-CM

## 2020-04-24 DIAGNOSIS — Z1211 Encounter for screening for malignant neoplasm of colon: Secondary | ICD-10-CM | POA: Diagnosis not present

## 2020-04-24 DIAGNOSIS — R413 Other amnesia: Secondary | ICD-10-CM

## 2020-04-24 MED ORDER — METOPROLOL SUCCINATE ER 25 MG PO TB24: 25.0000 mg | ORAL_TABLET | Freq: Every day | ORAL | 1 refills | Status: DC

## 2020-04-24 NOTE — Progress Notes (Signed)
Established patient visit   Patient: Stacey [name]   DOB: 31-Jan-1946   75 y.o. Female  MRN: [id number] Visit Date: 04/24/2020  Today's healthcare provider: Trinna Post, PA-C   Chief Complaint  Patient presents with  . Fall  I,Stacey [name] M Elayah Klooster,acting as a Education administrator for Trinna Post, PA-C.,have documented all relevant documentation on the behalf of Trinna Post, PA-C,as directed by  Trinna Post, PA-C while in the presence of Trinna Post, PA-C.  Subjective    Fall The accident occurred 2 days ago. The fall occurred from a bed, while standing and while walking. She landed on carpet (wood ). The volume of blood lost was moderate (nose). The point of impact was the face, head and left knee. The pain is at a severity of 0/10. The patient is experiencing no pain. Pertinent negatives include no headaches, hearing loss, loss of consciousness, numbness, tingling or visual change. She has tried NSAID and ice for the symptoms. The treatment provided mild relief.    Patient with history of hypertension on metorpolol has fallen twice in the past week. First fall on Friday 04/19/2020 and yesterday 04/23/2020. The first fall patient is unaware of what happened. Daughter visited her mother and reports patient may have possibly tripped. Patient's daughter reports she came to her mother's house and found her mother on the ground. Patient's daughter helped her up and it seemed the patient was moving at baseline. Patient hit her head on the floor which is a hard surface, did not pass out and did not vomit. There was also a second fall yesterday. Patient reports she was getting out of bed to answer the phone. She reached over the get the phone on her night stand and hit her nose at that time. Patient currently lives in [place] [facility] apartments in independent living facility. She does not currently have a life alert. She is not having a headache, vision change, vision loss, nausea,  vomiting, or confusion above baseline. She does have some baseline dementia.   Daughter would like her bloodwork and urine checked.   Patient's daughter also has questions about routine maintenance exams including colon cancer screening, mammogram, shingles vaccine. Mammo 2016 normal. Cologuard 2018 negative. No shingles vaccine documented. BMD 2016 with t score -3.8.   Pulse Readings from Last 3 Encounters:  04/24/20 60  08/21/19 63  10/10/18 68        Medications: Outpatient Medications Prior to Visit  Medication Sig  . acetaminophen (TYLENOL) 325 MG tablet Take 2 tablets (650 mg total) by mouth every 6 (six) hours as needed for mild pain (or Fever >/= 101).  . Calcium Citrate-Vitamin D (CALCIUM + D PO) Take 1 tablet by mouth daily.  . Cholecalciferol (VITAMIN D-3) 1000 UNITS CAPS Take 1 capsule by mouth daily.  . Coenzyme Q10 (COQ-10) 100 MG CAPS Take 100 mg by mouth daily.  Marland Kitchen donepezil (ARICEPT) 5 MG tablet TAKE ONE TABLET BY MOUTH AT BEDTIME  . GARLIC PO Take 8,144 mg by mouth daily.   . Omega-3 Fatty Acids (FISH OIL PO) Take 300 mg by mouth.   . pravastatin (PRAVACHOL) 20 MG tablet TAKE ONE TABLET BY MOUTH DAILY  . Red Yeast Rice Extract (RED YEAST RICE PO) Take 600 mg by mouth daily.   . Turmeric Curcumin 500 MG CAPS Take 500 mg by mouth daily.  . vitamin C (ASCORBIC ACID) 500 MG tablet Take 1,000 mg by mouth daily.   . [  DISCONTINUED] metoprolol succinate (TOPROL-XL) 100 MG 24 hr tablet TAKE 1/2 TABLET BY MOUTH DAILY   No facility-administered medications prior to visit.    Review of Systems  Neurological: Negative for tingling, loss of consciousness, numbness and headaches.       Objective    BP 123/75 (BP Location: Right Arm, Patient Position: Sitting, Cuff Size: Large)   Pulse 60   Temp 98.3 F (36.8 C) (Oral)   Wt 139 lb 1.6 oz (63.1 kg)   SpO2 98%   BMI 23.15 kg/m     Physical Exam Constitutional:      Appearance: Normal appearance.  Cardiovascular:      Rate and Rhythm: Normal rate and regular rhythm.     Heart sounds: Normal heart sounds.  Pulmonary:     Breath sounds: Normal breath sounds.  Skin:    General: Skin is warm and dry.  Neurological:     Mental Status: She is alert and oriented to person, place, and time. Mental status is at baseline.       No results found for any visits on 04/24/20.  Assessment & Plan    1. Annual physical exam  She declines mammography. Advised to contact insurance regarding payment of Shingrix.   2. Primary hypertension  Pulse is on the lower side, may be contributing to falls. Will decrease metoprolol to 25 mg daily from 50 mg daily.   - TSH - Lipid panel - Comprehensive metabolic panel - CBC with Differential/Platelet - metoprolol succinate (TOPROL-XL) 25 MG 24 hr tablet; Take 1 tablet (25 mg total) by mouth daily.  Dispense: 90 tablet; Refill: 1  3. Colon cancer screening  - Cologuard  4. Cut of face   5. Encounter for screening mammogram for malignant neoplasm of breast  Declined.  77. Fall, initial encounter  Advised to contact insurance about life alert. Patient currently living independently in an apartment.   - Urine Culture - POCT Urinalysis Dipstick - Fe+TIBC+Fer  7. Need for tetanus booster  Updated today.   8. Age-related osteoporosis without current pathological fracture  Previous DEXA 2016 with osteoporosis and t score -3.8. Patient was started on evista at the time, which has since been discontinued. In 2019 she declined another bone mineral density scan. In 2020 fell and had a left hip fracture. Will address at next visit.      Return in about 4 weeks (around 05/22/2020) for falls, HTN.      I, Trinna Post, PA-C, have reviewed all documentation for this visit. The documentation on 04/25/20 for the exam, diagnosis, procedures, and orders are all accurate and complete.  The entirety of the information documented in the History of Present Illness,  Review of Systems and Physical Exam were personally obtained by me. Portions of this information were initially documented by [facility] and reviewed by me for thoroughness and accuracy.     Paulene Floor  [facility] [phone] (phone) [phone] (fax)  [place]

## 2020-04-25 DIAGNOSIS — I10 Essential (primary) hypertension: Secondary | ICD-10-CM | POA: Diagnosis not present

## 2020-04-30 ENCOUNTER — Telehealth: Payer: Self-pay

## 2020-04-30 NOTE — Telephone Encounter (Signed)
Patient was advised. She denies any urinary symptoms at the moment. Patient reports she will call if any symptoms changes. Just FYI

## 2020-04-30 NOTE — Telephone Encounter (Signed)
-----   Message from Trinna Post, [place] sent at 04/26/2020  3:20 PM EST ----- Labs stable.

## 2020-04-30 NOTE — Progress Notes (Signed)
Advised via another message. 

## 2020-05-01 LAB — COMPREHENSIVE METABOLIC PANEL
ALT: 14 IU/L (ref 0–32)
AST: 22 IU/L (ref 0–40)
Albumin/Globulin Ratio: 2.5 — ABNORMAL HIGH (ref 1.2–2.2)
Albumin: 4.2 g/dL (ref 3.7–4.7)
Alkaline Phosphatase: 92 IU/L (ref 44–121)
BUN/Creatinine Ratio: 15 (ref 12–28)
BUN: 16 mg/dL (ref 8–27)
Bilirubin Total: 0.4 mg/dL (ref 0.0–1.2)
CO2: 24 mmol/L (ref 20–29)
Calcium: 9.9 mg/dL (ref 8.7–10.3)
Chloride: 107 mmol/L — ABNORMAL HIGH (ref 96–106)
Creatinine, Ser: 1.06 mg/dL — ABNORMAL HIGH (ref 0.57–1.00)
GFR calc Af Amer: 59 mL/min/{1.73_m2} — ABNORMAL LOW (ref >59)
GFR calc non Af Amer: 51 mL/min/{1.73_m2} — ABNORMAL LOW (ref >59)
Globulin, Total: 1.7 g/dL (ref 1.5–4.5)
Glucose: 75 mg/dL (ref 65–99)
Potassium: 4.2 mmol/L (ref 3.5–5.2)
Sodium: 144 mmol/L (ref 134–144)
Total Protein: 5.9 g/dL — ABNORMAL LOW (ref 6.0–8.5)

## 2020-05-01 LAB — CBC WITH DIFFERENTIAL/PLATELET
Basophils Absolute: 0.1 10*3/uL (ref 0.0–0.2)
Basos: 1 %
EOS (ABSOLUTE): 0.1 10*3/uL (ref 0.0–0.4)
Eos: 1 %
Hematocrit: 42 % (ref 34.0–46.6)
Hemoglobin: 13.4 g/dL (ref 11.1–15.9)
Immature Grans (Abs): 0 10*3/uL (ref 0.0–0.1)
Immature Granulocytes: 0 %
Lymphocytes Absolute: 4.4 10*3/uL — ABNORMAL HIGH (ref 0.7–3.1)
Lymphs: 44 %
MCH: 29.1 pg (ref 26.6–33.0)
MCHC: 31.9 g/dL (ref 31.5–35.7)
MCV: 91 fL (ref 79–97)
Monocytes Absolute: 0.5 10*3/uL (ref 0.1–0.9)
Monocytes: 5 %
Neutrophils Absolute: 4.8 10*3/uL (ref 1.4–7.0)
Neutrophils: 49 %
Platelets: 181 10*3/uL (ref 150–450)
RBC: 4.61 x10E6/uL (ref 3.77–5.28)
RDW: 12.1 % (ref 11.7–15.4)
WBC: 9.8 10*3/uL (ref 3.4–10.8)

## 2020-05-01 LAB — LIPID PANEL
Chol/HDL Ratio: 3.3 ratio (ref 0.0–4.4)
Cholesterol, Total: 176 mg/dL (ref 100–199)
HDL: 54 mg/dL (ref >39)
LDL Chol Calc (NIH): 100 mg/dL — ABNORMAL HIGH (ref 0–99)
Triglycerides: 127 mg/dL (ref 0–149)
VLDL Cholesterol Cal: 22 mg/dL (ref 5–40)

## 2020-05-01 LAB — URINE CULTURE

## 2020-05-01 LAB — IRON,TIBC AND FERRITIN PANEL
Ferritin: 120 ng/mL (ref 15–150)
Iron Saturation: 32 % (ref 15–55)
Iron: 81 ug/dL (ref 27–139)
Total Iron Binding Capacity: 255 ug/dL (ref 250–450)
UIBC: 174 ug/dL (ref 118–369)

## 2020-05-01 LAB — TSH: TSH: 2.18 u[IU]/mL (ref 0.450–4.500)

## 2020-05-07 ENCOUNTER — Telehealth: Payer: Self-pay | Admitting: Family Medicine

## 2020-05-07 NOTE — Telephone Encounter (Signed)
Returned patient's daughter [name]) call and she was advised of labs and urine culture results. I advised her that patient was advised of results and she states that she will let provider know to advise her due to patient having dementia.

## 2020-05-07 NOTE — Telephone Encounter (Signed)
Patient's daughter called to get the lab results of the patient.  Stated that it has been over a week and they have not heard anything yet.  Please call to discuss, [name] at [phone]

## 2020-06-05 ENCOUNTER — Encounter: Payer: Self-pay | Admitting: Physician Assistant

## 2020-06-05 ENCOUNTER — Ambulatory Visit (INDEPENDENT_AMBULATORY_CARE_PROVIDER_SITE_OTHER): Payer: PPO | Admitting: Physician Assistant

## 2020-06-05 ENCOUNTER — Other Ambulatory Visit: Payer: Self-pay

## 2020-06-05 VITALS — BP 135/89 | HR 62 | Temp 98.4°F | Ht 66.0 in | Wt 134.8 lb

## 2020-06-05 DIAGNOSIS — Z23 Encounter for immunization: Secondary | ICD-10-CM

## 2020-06-05 DIAGNOSIS — L0390 Cellulitis, unspecified: Secondary | ICD-10-CM | POA: Diagnosis not present

## 2020-06-05 DIAGNOSIS — I10 Essential (primary) hypertension: Secondary | ICD-10-CM

## 2020-06-05 NOTE — Progress Notes (Signed)
Established patient visit   Patient: Stacey [name]   DOB: October 31, 1945   75 y.o. Female  MRN: [id number] Visit Date: 06/05/2020  Today's healthcare provider: Trinna Post, PA-C   No chief complaint on file.  Subjective    HPI  Hypertension, follow-up  BP Readings from Last 3 Encounters:  06/05/20 135/89  04/24/20 123/75  08/21/19 130/82   Wt Readings from Last 3 Encounters:  06/05/20 134 lb 12.8 oz (61.1 kg)  04/24/20 139 lb 1.6 oz (63.1 kg)  08/21/19 147 lb (66.7 kg)     She was last seen for hypertension 1 months ago.  BP at that visit was 123/75. Management since that visit includes decreased metoprolol to 25 mg daily from 50 mg daily.   She reports good compliance with treatment. She is not having side effects.  She is following a Regular diet. She is not exercising. She does not smoke.  Use of agents associated with hypertension: none.   Outside blood pressures are not being checked. Symptoms: No chest pain No chest pressure  No palpitations No syncope  No dyspnea No orthopnea  No paroxysmal nocturnal dyspnea No lower extremity edema   Pertinent labs: Lab Results  Component Value Date   CHOL 176 04/25/2020   HDL 54 04/25/2020   LDLCALC 100 (H) 04/25/2020   TRIG 127 04/25/2020   CHOLHDL 3.3 04/25/2020   Lab Results  Component Value Date   NA 144 04/25/2020   K 4.2 04/25/2020   CREATININE 1.06 (H) 04/25/2020   GFRNONAA 51 (L) 04/25/2020   GFRAA 59 (L) 04/25/2020   GLUCOSE 75 04/25/2020     The 10-year ASCVD risk score Mikey Bussing DC Jr., et al., 2013) is: 22.9%   ---------------------------------------------------------------------------------------------------      Medications: Outpatient Medications Prior to Visit  Medication Sig  . acetaminophen (TYLENOL) 325 MG tablet Take 2 tablets (650 mg total) by mouth every 6 (six) hours as needed for mild pain (or Fever >/= 101).  . Calcium Citrate-Vitamin D (CALCIUM + D PO) Take 1 tablet by  mouth daily.  . Cholecalciferol (VITAMIN D-3) 1000 UNITS CAPS Take 1 capsule by mouth daily.  . Coenzyme Q10 (COQ-10) 100 MG CAPS Take 100 mg by mouth daily.  Marland Kitchen donepezil (ARICEPT) 5 MG tablet TAKE ONE TABLET BY MOUTH AT BEDTIME  . GARLIC PO Take 8,182 mg by mouth daily.   . metoprolol succinate (TOPROL-XL) 25 MG 24 hr tablet Take 1 tablet (25 mg total) by mouth daily.  . Omega-3 Fatty Acids (FISH OIL PO) Take 300 mg by mouth.   . pravastatin (PRAVACHOL) 20 MG tablet TAKE ONE TABLET BY MOUTH DAILY  . Red Yeast Rice Extract (RED YEAST RICE PO) Take 600 mg by mouth daily.   . Turmeric Curcumin 500 MG CAPS Take 500 mg by mouth daily.  . vitamin C (ASCORBIC ACID) 500 MG tablet Take 1,000 mg by mouth daily.    No facility-administered medications prior to visit.    Review of Systems     Objective    BP 135/89 (BP Location: Right Arm, Patient Position: Sitting, Cuff Size: Normal)   Pulse 62   Temp 98.4 F (36.9 C) (Oral)   Ht 5\' 6"  (1.676 m)   Wt 134 lb 12.8 oz (61.1 kg)   SpO2 97%   BMI 21.76 kg/m     Physical Exam Constitutional:      Appearance: Normal appearance.  Cardiovascular:     Rate and Rhythm:  Normal rate and regular rhythm.  Pulmonary:     Effort: Pulmonary effort is normal. No respiratory distress.  Skin:    General: Skin is warm and dry.  Neurological:     Mental Status: She is alert. Mental status is at baseline.  Psychiatric:        Mood and Affect: Mood normal.        Behavior: Behavior normal.       No results found for any visits on 06/05/20.  Assessment & Plan    1. Primary hypertension  She reports improvement in dizzy spells and hasn't fallen since. Continue metoprolol at lower dose.   2. Wound cellulitis  - Tdap vaccine greater than or equal to 7yo IM  3. Need for shingles vaccine  Recommend checking with insurance where it is paid for.    No follow-ups on file.      ITrinna Post, PA-C, have reviewed all documentation for  this visit. The documentation on 06/11/20 for the exam, diagnosis, procedures, and orders are all accurate and complete.  The entirety of the information documented in the History of Present Illness, Review of Systems and Physical Exam were personally obtained by me. Portions of this information were initially documented by [facility] and reviewed by me for thoroughness and accuracy.     Paulene Floor  [facility] [phone] (phone) [phone] (fax)  [place]

## 2020-06-11 DIAGNOSIS — H903 Sensorineural hearing loss, bilateral: Secondary | ICD-10-CM | POA: Diagnosis not present

## 2020-06-11 DIAGNOSIS — H6123 Impacted cerumen, bilateral: Secondary | ICD-10-CM | POA: Diagnosis not present

## 2020-06-23 ENCOUNTER — Other Ambulatory Visit: Payer: Self-pay | Admitting: Family Medicine

## 2020-07-27 ENCOUNTER — Other Ambulatory Visit: Payer: Self-pay | Admitting: Family Medicine

## 2020-07-27 DIAGNOSIS — R413 Other amnesia: Secondary | ICD-10-CM

## 2020-07-29 DIAGNOSIS — F0280 Dementia in other diseases classified elsewhere without behavioral disturbance: Secondary | ICD-10-CM | POA: Diagnosis not present

## 2020-07-29 DIAGNOSIS — G309 Alzheimer's disease, unspecified: Secondary | ICD-10-CM | POA: Diagnosis not present

## 2020-07-29 DIAGNOSIS — H9193 Unspecified hearing loss, bilateral: Secondary | ICD-10-CM | POA: Diagnosis not present

## 2020-07-29 DIAGNOSIS — F0150 Vascular dementia without behavioral disturbance: Secondary | ICD-10-CM | POA: Diagnosis not present

## 2020-08-01 DIAGNOSIS — Z1211 Encounter for screening for malignant neoplasm of colon: Secondary | ICD-10-CM | POA: Diagnosis not present

## 2020-08-01 LAB — COLOGUARD: Cologuard: NEGATIVE

## 2020-08-05 DIAGNOSIS — H2513 Age-related nuclear cataract, bilateral: Secondary | ICD-10-CM | POA: Diagnosis not present

## 2020-08-08 LAB — COLOGUARD: COLOGUARD: NEGATIVE

## 2020-08-21 ENCOUNTER — Other Ambulatory Visit: Payer: Self-pay

## 2020-08-21 DIAGNOSIS — Z1211 Encounter for screening for malignant neoplasm of colon: Secondary | ICD-10-CM

## 2020-09-12 ENCOUNTER — Encounter: Payer: Self-pay | Admitting: Family Medicine

## 2020-09-12 ENCOUNTER — Other Ambulatory Visit: Payer: Self-pay

## 2020-09-12 ENCOUNTER — Ambulatory Visit (INDEPENDENT_AMBULATORY_CARE_PROVIDER_SITE_OTHER): Payer: PPO | Admitting: Family Medicine

## 2020-09-12 VITALS — BP 137/88 | HR 72 | Temp 98.1°F | Resp 16 | Wt 139.0 lb

## 2020-09-12 DIAGNOSIS — R413 Other amnesia: Secondary | ICD-10-CM | POA: Diagnosis not present

## 2020-09-12 DIAGNOSIS — Z111 Encounter for screening for respiratory tuberculosis: Secondary | ICD-10-CM

## 2020-09-12 NOTE — Progress Notes (Signed)
Established patient visit   Patient: Stacey [name]   DOB: 1946/02/05   75 y.o. Female  MRN: [id number] Visit Date: 09/12/2020  Today's healthcare provider: Vernie Murders, PA-C   Chief Complaint  Patient presents with   Follow-up   Subjective    HPI  Patient here today with her daughter Stacey [name]. Patient will be going to [facility] for help with memory. Patient does need a TB test.   Follow up for memory deficits  The patient was last seen for this 1 years ago. Changes made at last visit include no changes.  She reports excellent compliance with treatment. She feels that condition is Unchanged. She is not having side effects.   -----------------------------------------------------------------------------------------  Patient Active Problem List   Diagnosis Date Noted   Hypertension    Osteoporosis    Olecranon bursitis    Hyperlipidemia    Other shoulder lesions, right shoulder 03/08/2015   Complete rotator cuff rupture of left shoulder 03/08/2015   Intertrochanteric fracture of right femur ([place]) 02/04/2015   Past Surgical History:  Procedure Laterality Date   ABDOMINAL HYSTERECTOMY     INTRAMEDULLARY (IM) NAIL INTERTROCHANTERIC Right 02/05/2015   Procedure: INTRAMEDULLARY (IM) NAIL INTERTROCHANTRIC;  Surgeon: Corky Mull, MD;  Location: [facility] ORS;  Service: Orthopedics;  Laterality: Right;   Family History  Problem Relation Age of Onset   Cancer Mother        colon   Cancer Father        lung cancer    Social History Socioeconomic History   Marital status: Married    Spouse name: Not on file   Number of children: Not on file   Years of education: Not on file   Highest education level: Not on file  Occupational History   Not on file  Tobacco Use   Smoking status: Never   Smokeless tobacco: Never  Substance and Sexual Activity   Alcohol use: No   Drug use: No   Sexual activity: Not Currently  Other Topics Concern   Not on file   Social History Narrative   Not on file   Social Determinants of Health   Financial Resource Strain: Not on file  Food Insecurity: Not on file  Transportation Needs: Not on file  Physical Activity: Not on file  Stress: Not on file  Social Connections: Not on file  Intimate Partner Violence: Not on file   No Known Allergies     Medications: Outpatient Medications Prior to Visit  Medication Sig   acetaminophen (TYLENOL) 325 MG tablet Take 2 tablets (650 mg total) by mouth every 6 (six) hours as needed for mild pain (or Fever >/= 101).   Calcium Citrate-Vitamin D (CALCIUM + D PO) Take 1 tablet by mouth daily.   Cholecalciferol (VITAMIN D-3) 1000 UNITS CAPS Take 1 capsule by mouth daily.   Coenzyme Q10 (COQ-10) 100 MG CAPS Take 100 mg by mouth daily.   donepezil (ARICEPT) 5 MG tablet TAKE ONE TABLET BY MOUTH EVERY NIGHT AT BEDTIME   metoprolol succinate (TOPROL-XL) 25 MG 24 hr tablet Take 1 tablet (25 mg total) by mouth daily.   Omega-3 Fatty Acids (FISH OIL PO) Take 300 mg by mouth.   pravastatin (PRAVACHOL) 20 MG tablet TAKE ONE TABLET BY MOUTH DAILY   Red Yeast Rice Extract (RED YEAST RICE PO) Take 600 mg by mouth daily.    Turmeric Curcumin 500 MG CAPS Take 500 mg by mouth daily.  vitamin C (ASCORBIC ACID) 500 MG tablet Take 1,000 mg by mouth daily.    [DISCONTINUED] GARLIC PO Take 0,092 mg by mouth daily.  (Patient not taking: Reported on 09/12/2020)   No facility-administered medications prior to visit.    Review of Systems  Constitutional:  Negative for activity change, appetite change and fatigue.  Respiratory:  Negative for chest tightness and shortness of breath.   Cardiovascular:  Negative for chest pain and palpitations.  Psychiatric/Behavioral:  Positive for confusion and decreased concentration. Negative for agitation, behavioral problems and dysphoric mood.    Last CBC Lab Results  Component Value Date   WBC 9.8 04/25/2020   HGB 13.4 04/25/2020   HCT 42.0  04/25/2020   MCV 91 04/25/2020   MCH 29.1 04/25/2020   RDW 12.1 04/25/2020   PLT 181 33/00/7622   Last metabolic panel Lab Results  Component Value Date   GLUCOSE 75 04/25/2020   NA 144 04/25/2020   K 4.2 04/25/2020   CL 107 (H) 04/25/2020   CO2 24 04/25/2020   BUN 16 04/25/2020   CREATININE 1.06 (H) 04/25/2020   GFRNONAA 51 (L) 04/25/2020   GFRAA 59 (L) 04/25/2020   CALCIUM 9.9 04/25/2020   PROT 5.9 (L) 04/25/2020   ALBUMIN 4.2 04/25/2020   LABGLOB 1.7 04/25/2020   AGRATIO 2.5 (H) 04/25/2020   BILITOT 0.4 04/25/2020   ALKPHOS 92 04/25/2020   AST 22 04/25/2020   ALT 14 04/25/2020   ANIONGAP 5 02/08/2015   Last lipids Lab Results  Component Value Date   CHOL 176 04/25/2020   HDL 54 04/25/2020   LDLCALC 100 (H) 04/25/2020   TRIG 127 04/25/2020   CHOLHDL 3.3 04/25/2020   Last thyroid functions Lab Results  Component Value Date   TSH 2.180 04/25/2020   Last vitamin D Lab Results  Component Value Date   VD25OH 68.5 08/21/2019        Objective    BP 137/88 (BP Location: Right Arm, Patient Position: Sitting, Cuff Size: Normal)   Pulse 72   Temp 98.1 F (36.7 C) (Oral)   Resp 16   Wt 139 lb (63 kg)   SpO2 98%   BMI 22.44 kg/m  BP Readings from Last 3 Encounters:  09/12/20 137/88  06/05/20 135/89  04/24/20 123/75   Wt Readings from Last 3 Encounters:  09/12/20 139 lb (63 kg)  06/05/20 134 lb 12.8 oz (61.1 kg)  04/24/20 139 lb 1.6 oz (63.1 kg)    Physical Exam Constitutional:      General: She is not in acute distress.    Appearance: She is well-developed.  HENT:     Head: Normocephalic and atraumatic.     Right Ear: Hearing and tympanic membrane normal.     Left Ear: Hearing and tympanic membrane normal.     Nose: Nose normal.  Eyes:     General: Lids are normal. No scleral icterus.       Right eye: No discharge.        Left eye: No discharge.     Conjunctiva/sclera: Conjunctivae normal.  Cardiovascular:     Rate and Rhythm: Normal rate  and regular rhythm.     Heart sounds: Normal heart sounds.  Pulmonary:     Effort: Pulmonary effort is normal. No respiratory distress.     Breath sounds: Normal breath sounds.  Abdominal:     General: Bowel sounds are normal.     Palpations: Abdomen is soft.  Musculoskeletal:  General: Normal range of motion.     Comments: Slight limp with history of right hip replacement.  Skin:    Findings: No lesion or rash.  Neurological:     Mental Status: She is alert.     Comments: Cranial nerves intact. Moves all extremities well.   Psychiatric:        Speech: Speech normal.        Behavior: Behavior normal.        Thought Content: Thought content normal.        Cognition and Memory: Memory is impaired.      No results found for any visits on 09/12/20.  Assessment & Plan     1. Memory deficits Happy attitude with memory impairment followed by Dr. 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 Presents with daughter that reports she is still on the Aricept but notices she is repeating herself frequently. Needs forms completed for admission to [facility].  2. Screening-pulmonary TB - QuantiFERON-TB Gold Plus   No follow-ups on file.      I, Talbert Trembath, PA-C, have reviewed all documentation for this visit. The documentation on 09/30/20 for the exam, diagnosis, procedures, and orders are all accurate and complete.    Vernie Murders, PA-C  [employer] [phone] (phone) [phone] (fax)  [place]

## 2020-09-18 LAB — QUANTIFERON-TB GOLD PLUS
QuantiFERON Mitogen Value: >10 IU/mL
QuantiFERON Nil Value: 0 IU/mL
QuantiFERON TB1 Ag Value: 0.02 IU/mL
QuantiFERON TB2 Ag Value: 0.11 IU/mL
QuantiFERON-TB Gold Plus: NEGATIVE

## 2020-09-25 ENCOUNTER — Other Ambulatory Visit: Payer: Self-pay | Admitting: Family Medicine

## 2020-09-25 DIAGNOSIS — E782 Mixed hyperlipidemia: Secondary | ICD-10-CM

## 2020-10-23 ENCOUNTER — Telehealth: Payer: Self-pay | Admitting: Family Medicine

## 2020-10-23 ENCOUNTER — Other Ambulatory Visit: Payer: Self-pay | Admitting: Family Medicine

## 2020-10-23 DIAGNOSIS — R413 Other amnesia: Secondary | ICD-10-CM

## 2020-10-23 NOTE — Telephone Encounter (Signed)
[facility] faxed refill request for the following medications:  metoprolol succinate (TOPROL-XL) 25 MG 24 hr tablet   Please advise.  

## 2020-10-23 NOTE — Telephone Encounter (Signed)
Requested Prescriptions  Pending Prescriptions Disp Refills  . donepezil (ARICEPT) 5 MG tablet [Pharmacy Med Name: DONEPEZIL HCL 5 MG TABLET] 30 tablet 2    Sig: TAKE ONE TABLET BY MOUTH EVERY NIGHT AT BEDTIME     Neurology:  Alzheimer's Agents Passed - 10/23/2020  6:21 AM      Passed - Valid encounter within last 6 months    Recent Outpatient Visits          1 month ago Memory deficits   [place], PA-C   4 months ago Primary hypertension   [place], [place], PA-C   6 months ago Annual physical exam   [facility] Trinna Post, [place]   1 year ago Memory deficits   [employer], Vickki Muff, PA-C   2 years ago History of fracture of left hip   [place], Vickki Muff, [place]

## 2020-10-25 ENCOUNTER — Other Ambulatory Visit: Payer: Self-pay

## 2020-10-25 DIAGNOSIS — I10 Essential (primary) hypertension: Secondary | ICD-10-CM

## 2020-10-25 MED ORDER — METOPROLOL SUCCINATE ER 25 MG PO TB24: 25.0000 mg | ORAL_TABLET | Freq: Every day | ORAL | 1 refills | Status: DC

## 2020-12-23 DIAGNOSIS — H9193 Unspecified hearing loss, bilateral: Secondary | ICD-10-CM | POA: Diagnosis not present

## 2020-12-23 DIAGNOSIS — F0280 Dementia in other diseases classified elsewhere without behavioral disturbance: Secondary | ICD-10-CM | POA: Diagnosis not present

## 2020-12-23 DIAGNOSIS — G309 Alzheimer's disease, unspecified: Secondary | ICD-10-CM | POA: Diagnosis not present

## 2020-12-23 DIAGNOSIS — F0150 Vascular dementia without behavioral disturbance: Secondary | ICD-10-CM | POA: Diagnosis not present

## 2021-03-26 ENCOUNTER — Other Ambulatory Visit: Payer: Self-pay

## 2021-03-26 ENCOUNTER — Telehealth: Payer: Self-pay | Admitting: Family Medicine

## 2021-03-26 DIAGNOSIS — E782 Mixed hyperlipidemia: Secondary | ICD-10-CM

## 2021-03-26 MED ORDER — PRAVASTATIN SODIUM 20 MG PO TABS: 20.0000 mg | ORAL_TABLET | Freq: Every day | ORAL | 0 refills | Status: DC

## 2021-03-26 NOTE — Telephone Encounter (Signed)
[facility] faxed refill request for the following medications:  pravastatin (PRAVACHOL) 20 MG tablet   Please advise.

## 2021-04-28 ENCOUNTER — Telehealth: Payer: Self-pay | Admitting: Family Medicine

## 2021-04-28 ENCOUNTER — Other Ambulatory Visit: Payer: Self-pay

## 2021-04-28 DIAGNOSIS — I10 Essential (primary) hypertension: Secondary | ICD-10-CM

## 2021-04-28 MED ORDER — METOPROLOL SUCCINATE ER 25 MG PO TB24: 25.0000 mg | ORAL_TABLET | Freq: Every day | ORAL | 0 refills | Status: DC

## 2021-04-28 NOTE — Telephone Encounter (Signed)
[facility] faxed refill request for the following medications:  metoprolol succinate (TOPROL-XL) 25 MG 24 hr tablet   Please advise.  

## 2021-06-29 ENCOUNTER — Other Ambulatory Visit: Payer: Self-pay | Admitting: Family Medicine

## 2021-06-29 DIAGNOSIS — E782 Mixed hyperlipidemia: Secondary | ICD-10-CM

## 2021-07-11 ENCOUNTER — Ambulatory Visit: Payer: Self-pay

## 2021-07-11 NOTE — Telephone Encounter (Addendum)
?Chief Complaint: back pain after fall ?Symptoms: back pain, slight bruising ?Frequency: 3 days ago ?Pertinent Negatives: Patient denies daughter denies that pt has worsened except for back pain ?Disposition: '[]'$ ED /'[]'$ Urgent Care (no appt availability in office) / '[]'$ Appointment(In office/virtual)/ '[]'$  [facility]/ '[x]'$ Home Care/ '[]'$ Refused Recommended Disposition /'[]'$ [place] [facility]/ '[]'$  Follow-up with PCP ?Additional Notes: No appts available in office advised daughter that she needs to take her to UC - daughter stated what would they do and advised daughter that she could have caused damaged to her head and her back is hurting. Daughter refused to take pt to ED. ? ? ? ?Reason for Disposition ? [1] Caller has NON-URGENT question AND [2] triager unable to answer question ? Patient is confused or is an unreliable provider of information (e.g., dementia, severe intellectual disability, alcohol intoxication) ? Patient is confused or is an unreliable provider of information (e.g., dementia, severe intellectual disability, alcohol intoxication) ? ?Answer Assessment - Initial Assessment Questions ?1. MECHANISM: "How did the fall happen?" ?    Was walking and fell "slippery shoes" ?2. DOMESTIC VIOLENCE AND ELDER ABUSE SCREENING: "Did you fall because someone pushed you or tried to hurt you?" If Yes, ask: "Are you safe now?" ?    no ?3. ONSET: "When did the fall happen?" (e.g., minutes, hours, or days ago) ?    Head and back ?4. LOCATION: "What part of the body hit the ground?" (e.g., back, buttocks, head, hips, knees, hands, head, stomach) ?    Back mid, head ?5. INJURY: "Did you hurt (injure) yourself when you fell?" If Yes, ask: "What did you injure? Tell me more about this?" (e.g., body area; type of injury; pain severity)" ?    bruising ?6. PAIN: "Is there any pain?" If Yes, ask: "How bad is the pain?" (e.g., Scale 1-10; or mild,  ?moderate, severe) ?  - NONE (0): No pain ?  - MILD (1-3): Doesn't  interfere with normal activities  ?  - MODERATE (4-7): Interferes with normal activities or awakens from sleep  ?  - SEVERE (8-10): Excruciating pain, unable to do any normal activities  ?    mild ?7. SIZE: For cuts, bruises, or swelling, ask: "How large is it?" (e.g., inches or centimeters)  ?    small ?8. PREGNANCY: "Is there any chance you are pregnant?" "When was your last menstrual period?" ?    *No Answer* ?9. OTHER SYMPTOMS: "Do you have any other symptoms?" (e.g., dizziness, fever, weakness; new onset or worsening).  ?    no ?10. CAUSE: "What do you think caused the fall (or falling)?" (e.g., tripped, dizzy spell) ?      Was not using cane ? ?Answer Assessment - Initial Assessment Questions ?1. MECHANISM: "How did the injury happen?" For falls, ask: "What height did you fall from?" and "What surface did you fall against?"  ?    [place] was walking ?2. ONSET: "When did the injury happen?" (Minutes or hours ago)  ?    3 days ago ?3. NEUROLOGIC SYMPTOMS: "Was there any loss of consciousness?" "Are there any other neurological symptoms?"  ?    No- no pt with dementia no changes per daughter ?4. MENTAL STATUS: "Does the person know who they are, who you are, and where they are?"  ?  ?5. LOCATION: "What part of the head was hit?"  ?    back ?6. SCALP APPEARANCE: "What does the scalp look like? Is it bleeding now?" If Yes, ask: "  Is it difficult to stop?"  ?    No injury ?7. SIZE: For cuts, bruises, or swelling, ask: "How large is it?" (e.g., inches or centimeters)  ?    N/a ?8. PAIN: "Is there any pain?" If Yes, ask: "How bad is it?"  (e.g., Scale 1-10; or mild, moderate, severe) ?    Yes moderate pt is asking for pain medicine at least every 3 hours ?9. TETANUS: For any breaks in the skin, ask: "When was the last tetanus booster?" ?    no ?10. OTHER SYMPTOMS: "Do you have any other symptoms?" (e.g., neck pain, vomiting) ?      Mid back pain with small bruise ?11. PREGNANCY: "Is there any chance you are pregnant?"  "When was your last menstrual period?" ?      *No Answer* ? ?Answer Assessment - Initial Assessment Questions ?1. MECHANISM: "How did the injury happen?" (Consider the possibility of domestic violence or elder abuse) ?    fall ?2. ONSET: "When did the injury happen?" (Minutes or hours ago) ?    3 days ago ?3. LOCATION: "What part of the back is injured?" ?    Mid back ?4. SEVERITY: "Can you move the back normally?" ?    No change with movement ?5. PAIN: "Is there any pain?" If Yes, ask: "How bad is the pain?"   (Scale 1-10; or mild, moderate, severe) ?    Yes- pt unable to rate asking for tyleol ot Ibuprofen at every 3 hours ?6. CORD SYMPTOMS: Any weakness or numbness of the arms or legs?" ?    no ?7. SIZE: For cuts, bruises, or swelling, ask: "How large is it?" (e.g., inches or centimeters) ?    "Small" ?8. TETANUS: For any breaks in the skin, ask: "When was the last tetanus booster?" ?    N/a ?9. OTHER SYMPTOMS: "Do you have any other symptoms?" (e.g., abdominal pain, blood in urine) ?    no ?10. PREGNANCY: "Is there any chance you are pregnant?" "When was your last menstrual period?" ?      *No Answer* ? ?Protocols used: Falls and [place], Head [place], Back Injury-A-AH ? ?

## 2021-07-11 NOTE — Telephone Encounter (Signed)
Please advise 

## 2021-07-11 NOTE — Telephone Encounter (Signed)
Agree with UC if we have no appts available.  She should get looked at.

## 2021-07-11 NOTE — Telephone Encounter (Signed)
NA

## 2021-07-14 ENCOUNTER — Ambulatory Visit: Payer: Self-pay | Admitting: *Deleted

## 2021-07-14 NOTE — Telephone Encounter (Signed)
Reason for Disposition ? [1] Caller requesting NON-URGENT health information AND [2] PCP's office is the best resource ? ?Answer Assessment - Initial Assessment Questions ?1. REASON FOR CALL or QUESTION: "What is your reason for calling today?" or "How can I best help you?" or "What question do you have that I can help answer?" ?    Stacey [name], daughter returning a call from staff in [facility].   She did want to relay that her mother went to the urgent care on Friday.   They did an x ray and she has a fractured rib that is not displaced.  The x ray also showed a mass in her lung and an enlarged aorta and are recommending she see her PCP and get a CT scan done. ? ?Pt has not been seen since [employer], PA-C left the practice.   I called into the office but the person that called was in with a pt.   Message sent to have her call Stacey [name] back. ? ?Protocols used: Information Only Call - No Triage-A-AH ? ?

## 2021-07-14 NOTE — Telephone Encounter (Signed)
NA, LMTCB ?

## 2021-07-14 NOTE — Telephone Encounter (Addendum)
?  Chief Complaint: Stacey [name] returning a call to office staff with report of mother's visit to urgent care.   She needs an appt made but has not been seen by a provider since Vernie Murders, PA-C left the practice. ?Symptoms: fractured rib but not displaced  ?Frequency: N/A ?Pertinent Negatives: Patient denies N/A ?Disposition: '[]'$ ED /'[]'$ Urgent Care (no appt availability in office) / '[]'$ Appointment(In office/virtual)/ '[]'$  [facility]/ '[]'$ Home Care/ '[]'$ Refused Recommended Disposition /'[]'$ [place] [facility]/ '[]'$  Follow-up with PCP ?Additional Notes: Needs an appt for f/u on urgent care visit.   Needs to be assigned to a provider.    ? ?I called into [facility] to connect [name] with [place] who had called and left a message for a return call.   She wasn't available.   Message left for her to return the call regarding Andee Chivers to daughter Stacey [name]. ?

## 2021-07-15 ENCOUNTER — Encounter: Payer: Self-pay | Admitting: Physician Assistant

## 2021-07-15 ENCOUNTER — Ambulatory Visit (INDEPENDENT_AMBULATORY_CARE_PROVIDER_SITE_OTHER): Payer: PPO | Admitting: Physician Assistant

## 2021-07-15 VITALS — BP 140/101 | HR 60 | Wt 129.1 lb

## 2021-07-15 DIAGNOSIS — I10 Essential (primary) hypertension: Secondary | ICD-10-CM | POA: Diagnosis not present

## 2021-07-15 DIAGNOSIS — S2232XD Fracture of one rib, left side, subsequent encounter for fracture with routine healing: Secondary | ICD-10-CM

## 2021-07-15 DIAGNOSIS — E782 Mixed hyperlipidemia: Secondary | ICD-10-CM

## 2021-07-15 DIAGNOSIS — J9859 Other diseases of mediastinum, not elsewhere classified: Secondary | ICD-10-CM | POA: Diagnosis not present

## 2021-07-15 MED ORDER — LIDOCAINE 5 % EX PTCH: 1.0000 | MEDICATED_PATCH | Freq: Two times a day (BID) | CUTANEOUS | 0 refills | Status: AC | PRN

## 2021-07-15 NOTE — Progress Notes (Signed)
?  ? ? ?Established patient visit ? ? ?Patient: Stacey [name]   DOB: January 28, 1946   76 y.o. Female  MRN: [id number] ?Visit Date: 07/15/2021 ? ?Today's healthcare provider: [name], PA-C  ? ?Cc. F/u urgent care visit ? ?Subjective  ?  ?HPI  ? ?Stacey [name] is a 76 y/o female with a history of HTN, dementia, who presents today with her daughter post urgent care visit 07/11/21 after a mechanical ? Fall in her bathroom. Fall was unwittnessed. Pt states she thinks she hit her head, but denies LOC, headache, dizziness. Denies chest pain, weakness, dysphagia.  ?At urgent care a rib series was done, seen to have a L rib fracture. She was give lidocaine patches q 12 hours and alternates this with tylenol.  ?Pt is in no distress today has no further complaints. ? ?On rib series/chest xray an incidental mediastinal mass was seen. Further eval with CT was recommended.  ? ?Medications: ?Outpatient Medications Prior to Visit  ?Medication Sig  ? acetaminophen (TYLENOL) 325 MG tablet Take 2 tablets (650 mg total) by mouth every 6 (six) hours as needed for mild pain (or Fever >/= 101).  ? Calcium Citrate-Vitamin D (CALCIUM + D PO) Take 1 tablet by mouth daily.  ? Cholecalciferol (VITAMIN D-3) 1000 UNITS CAPS Take 1 capsule by mouth daily.  ? Coenzyme Q10 (COQ-10) 100 MG CAPS Take 100 mg by mouth daily.  ? donepezil (ARICEPT) 5 MG tablet TAKE ONE TABLET BY MOUTH EVERY NIGHT AT BEDTIME  ? metoprolol succinate (TOPROL-XL) 25 MG 24 hr tablet Take 1 tablet (25 mg total) by mouth daily.  ? Omega-3 Fatty Acids (FISH OIL PO) Take 300 mg by mouth.  ? pravastatin (PRAVACHOL) 20 MG tablet Take 1 tablet (20 mg total) by mouth daily.  ? Red Yeast Rice Extract (RED YEAST RICE PO) Take 600 mg by mouth daily.   ? Turmeric Curcumin 500 MG CAPS Take 500 mg by mouth daily.  ? vitamin C (ASCORBIC ACID) 500 MG tablet Take 1,000 mg by mouth daily.   ? [DISCONTINUED] lidocaine (LIDODERM) 5 % Apply 1 patch topically every 12 (twelve) hours as needed.   ? ?No facility-administered medications prior to visit.  ? ? ?Review of Systems  ?Constitutional:  Negative for fatigue and fever.  ?Respiratory:  Negative for cough and shortness of breath.   ?Cardiovascular:  Negative for chest pain and leg swelling.  ?Gastrointestinal:  Negative for abdominal pain.  ?Musculoskeletal:  Positive for myalgias.  ?Neurological:  Negative for dizziness and headaches.  ? ? ?  Objective  ?  ?Blood pressure (!) 140/101, pulse 60, weight 129 lb 1.6 oz (58.6 kg), SpO2 98 %.  ? ? ? ?Physical Exam ?Constitutional:   ?   General: She is awake.  ?   Appearance: She is well-developed.  ?HENT:  ?   Head: Normocephalic.  ?Eyes:  ?   Conjunctiva/sclera: Conjunctivae normal.  ?Cardiovascular:  ?   Rate and Rhythm: Normal rate and regular rhythm.  ?   Heart sounds: Normal heart sounds.  ?Pulmonary:  ?   Effort: Pulmonary effort is normal.  ?   Breath sounds: Normal breath sounds.  ?Musculoskeletal:  ?   Comments: Some tenderness L posterior ribs  ?Skin: ?   General: Skin is warm.  ?Neurological:  ?   Mental Status: She is alert and oriented to person, place, and time.  ?Psychiatric:     ?   Attention and Perception: Attention normal.     ?  Mood and Affect: Mood normal.     ?   Speech: Speech normal.     ?   Behavior: Behavior is cooperative.  ?  ? ?Results for orders placed or performed in visit on 07/15/21  ?CBC w/Diff/Platelet  ?Result Value Ref Range  ? WBC 9.9 3.4 - 10.8 x10E3/uL  ? RBC 4.87 3.77 - 5.28 x10E6/uL  ? Hemoglobin 14.7 11.1 - 15.9 g/dL  ? Hematocrit 43.6 34.0 - 46.6 %  ? MCV 90 79 - 97 fL  ? MCH 30.2 26.6 - 33.0 pg  ? MCHC 33.7 31.5 - 35.7 g/dL  ? RDW 12.5 11.7 - 15.4 %  ? Platelets 182 150 - 450 x10E3/uL  ? Neutrophils 45 Not Estab. %  ? Lymphs 48 Not Estab. %  ? Monocytes 5 Not Estab. %  ? Eos 1 Not Estab. %  ? Basos 1 Not Estab. %  ? Neutrophils Absolute 4.4 1.4 - 7.0 x10E3/uL  ? Lymphocytes Absolute 4.8 (H) 0.7 - 3.1 x10E3/uL  ? Monocytes Absolute 0.5 0.1 - 0.9 x10E3/uL  ? EOS  (ABSOLUTE) 0.1 0.0 - 0.4 x10E3/uL  ? Basophils Absolute 0.1 0.0 - 0.2 x10E3/uL  ? Immature Granulocytes 0 Not Estab. %  ? Immature Grans (Abs) 0.0 0.0 - 0.1 x10E3/uL  ?Comprehensive Metabolic Panel (CMET)  ?Result Value Ref Range  ? Glucose 90 70 - 99 mg/dL  ? BUN 22 8 - 27 mg/dL  ? Creatinine, Ser 1.15 (H) 0.57 - 1.00 mg/dL  ? eGFR 49 (L) >59 mL/min/1.73  ? BUN/Creatinine Ratio 19 12 - 28  ? Sodium 144 134 - 144 mmol/L  ? Potassium 3.9 3.5 - 5.2 mmol/L  ? Chloride 106 96 - 106 mmol/L  ? CO2 21 20 - 29 mmol/L  ? Calcium 10.0 8.7 - 10.3 mg/dL  ? Total Protein 6.3 6.0 - 8.5 g/dL  ? Albumin 4.4 3.7 - 4.7 g/dL  ? Globulin, Total 1.9 1.5 - 4.5 g/dL  ? Albumin/Globulin Ratio 2.3 (H) 1.2 - 2.2  ? Bilirubin Total 0.4 0.0 - 1.2 mg/dL  ? Alkaline Phosphatase 117 44 - 121 IU/L  ? AST 20 0 - 40 IU/L  ? ALT 10 0 - 32 IU/L  ?Lipid Profile  ?Result Value Ref Range  ? Cholesterol, Total 174 100 - 199 mg/dL  ? Triglycerides 164 (H) 0 - 149 mg/dL  ? HDL 53 >39 mg/dL  ? VLDL Cholesterol Cal 28 5 - 40 mg/dL  ? LDL Chol Calc (NIH) 93 0 - 99 mg/dL  ? Chol/HDL Ratio 3.3 0.0 - 4.4 ratio  ? ? Assessment & Plan  ?  ? ?Problem List Items Addressed This Visit   ? ?  ? Cardiovascular and Mediastinum  ? Hypertension  ?  Considerably elevated today ?Daughter is nurse reports in range at home ?Advised they record a few values to bring at next follow up  ? ?  ?  ? Relevant Orders  ? CBC w/Diff/Platelet (Completed)  ? Comprehensive Metabolic Panel (CMET) (Completed)  ?  ? Musculoskeletal and Integument  ? Closed fracture of rib of left side with routine healing  ?  Pain is expected ?Will refill lidocaine patches x 1 more week. Okay to alternate with tylenol ? ?  ?  ? Relevant Medications  ? lidocaine (LIDODERM) 5 %  ?  ? Other  ? Hyperlipidemia  ?  Currently managing with pravastatin 20 mg.  ?Will recheck lipids but not expecting to alter dose ? ?  ?  ?  Relevant Orders  ? Comprehensive Metabolic Panel (CMET) (Completed)  ? Lipid Profile (Completed)   ? Mediastinal mass - Primary  ?  Incidental on xrays ?09/2007 chest xray with no abnormalities ?Recommending proceeding with chest ct to further characterize but additional workup will depend what is seen, given comorbidity of dementia and what family prefers ? ?  ?  ? Relevant Orders  ? CT CHEST W CONTRAST  ?  ? ?Return in about 4 months (around 11/14/2021) for hypertension.  ?   ? ?I, [name], PA-C have reviewed all documentation for this visit. The documentation on  07/16/2021 for the exam, diagnosis, procedures, and orders are all accurate and complete. ? ?[name], PA-C ?[facility] [id number] ?[place], [place], [address] ?Office: [phone] ?Fax: [phone]  ? ?[facility] ? ?

## 2021-07-16 ENCOUNTER — Encounter: Payer: Self-pay | Admitting: Physician Assistant

## 2021-07-16 DIAGNOSIS — S2232XD Fracture of one rib, left side, subsequent encounter for fracture with routine healing: Secondary | ICD-10-CM | POA: Insufficient documentation

## 2021-07-16 DIAGNOSIS — J9859 Other diseases of mediastinum, not elsewhere classified: Secondary | ICD-10-CM | POA: Insufficient documentation

## 2021-07-16 LAB — LIPID PANEL
Chol/HDL Ratio: 3.3 ratio (ref 0.0–4.4)
Cholesterol, Total: 174 mg/dL (ref 100–199)
HDL: 53 mg/dL (ref >39)
LDL Chol Calc (NIH): 93 mg/dL (ref 0–99)
Triglycerides: 164 mg/dL — ABNORMAL HIGH (ref 0–149)
VLDL Cholesterol Cal: 28 mg/dL (ref 5–40)

## 2021-07-16 LAB — COMPREHENSIVE METABOLIC PANEL
ALT: 10 IU/L (ref 0–32)
AST: 20 IU/L (ref 0–40)
Albumin/Globulin Ratio: 2.3 — ABNORMAL HIGH (ref 1.2–2.2)
Albumin: 4.4 g/dL (ref 3.7–4.7)
Alkaline Phosphatase: 117 IU/L (ref 44–121)
BUN/Creatinine Ratio: 19 (ref 12–28)
BUN: 22 mg/dL (ref 8–27)
Bilirubin Total: 0.4 mg/dL (ref 0.0–1.2)
CO2: 21 mmol/L (ref 20–29)
Calcium: 10 mg/dL (ref 8.7–10.3)
Chloride: 106 mmol/L (ref 96–106)
Creatinine, Ser: 1.15 mg/dL — ABNORMAL HIGH (ref 0.57–1.00)
Globulin, Total: 1.9 g/dL (ref 1.5–4.5)
Glucose: 90 mg/dL (ref 70–99)
Potassium: 3.9 mmol/L (ref 3.5–5.2)
Sodium: 144 mmol/L (ref 134–144)
Total Protein: 6.3 g/dL (ref 6.0–8.5)
eGFR: 49 mL/min/{1.73_m2} — ABNORMAL LOW (ref >59)

## 2021-07-16 LAB — CBC WITH DIFFERENTIAL/PLATELET
Basophils Absolute: 0.1 10*3/uL (ref 0.0–0.2)
Basos: 1 %
EOS (ABSOLUTE): 0.1 10*3/uL (ref 0.0–0.4)
Eos: 1 %
Hematocrit: 43.6 % (ref 34.0–46.6)
Hemoglobin: 14.7 g/dL (ref 11.1–15.9)
Immature Grans (Abs): 0 10*3/uL (ref 0.0–0.1)
Immature Granulocytes: 0 %
Lymphocytes Absolute: 4.8 10*3/uL — ABNORMAL HIGH (ref 0.7–3.1)
Lymphs: 48 %
MCH: 30.2 pg (ref 26.6–33.0)
MCHC: 33.7 g/dL (ref 31.5–35.7)
MCV: 90 fL (ref 79–97)
Monocytes Absolute: 0.5 10*3/uL (ref 0.1–0.9)
Monocytes: 5 %
Neutrophils Absolute: 4.4 10*3/uL (ref 1.4–7.0)
Neutrophils: 45 %
Platelets: 182 10*3/uL (ref 150–450)
RBC: 4.87 x10E6/uL (ref 3.77–5.28)
RDW: 12.5 % (ref 11.7–15.4)
WBC: 9.9 10*3/uL (ref 3.4–10.8)

## 2021-07-16 NOTE — Assessment & Plan Note (Addendum)
Incidental on xrays ?09/2007 chest xray with no abnormalities ?Recommending proceeding with chest ct to further characterize but additional workup will depend what is seen, given comorbidity of dementia and what family prefers ?

## 2021-07-16 NOTE — Assessment & Plan Note (Signed)
Considerably elevated today ?Daughter is nurse reports in range at home ?Advised they record a few values to bring at next follow up  ?

## 2021-07-16 NOTE — Assessment & Plan Note (Signed)
Currently managing with pravastatin 20 mg.  ?Will recheck lipids but not expecting to alter dose ?

## 2021-07-16 NOTE — Assessment & Plan Note (Signed)
Pain is expected ?Will refill lidocaine patches x 1 more week. Okay to alternate with tylenol ?

## 2021-07-24 ENCOUNTER — Other Ambulatory Visit: Payer: Self-pay | Admitting: Family Medicine

## 2021-07-24 ENCOUNTER — Ambulatory Visit
Admission: RE | Admit: 2021-07-24 | Discharge: 2021-07-24 | Disposition: A | Discharge status: Home / Self Care | Payer: PPO | Source: Ambulatory Visit | Attending: Physician Assistant | Admitting: Physician Assistant

## 2021-07-24 DIAGNOSIS — J9859 Other diseases of mediastinum, not elsewhere classified: Secondary | ICD-10-CM | POA: Insufficient documentation

## 2021-07-24 DIAGNOSIS — I10 Essential (primary) hypertension: Secondary | ICD-10-CM

## 2021-07-24 DIAGNOSIS — E782 Mixed hyperlipidemia: Secondary | ICD-10-CM

## 2021-07-24 MED ORDER — IOHEXOL 300 MG/ML  SOLN
75.0000 mL | Freq: Once | INTRAMUSCULAR | Status: AC | PRN
  Administered 2021-07-24: 75 mL via INTRAVENOUS

## 2021-07-28 ENCOUNTER — Telehealth: Payer: Self-pay

## 2021-07-28 NOTE — Telephone Encounter (Signed)
Pt's daughter given lab results from Chest CT per notes of Ria Comment, [place] on 07/28/21. Stacey [name] verbalized understanding. Reviewed goal for BP and advised pt if has Bps higher than 140/90 to call in or if chest pain or any SOB to report. She verbalized understanding of this as well. No futher questions noted.  ?

## 2021-08-07 ENCOUNTER — Ambulatory Visit (INDEPENDENT_AMBULATORY_CARE_PROVIDER_SITE_OTHER): Payer: PPO

## 2021-08-07 VITALS — BP 110/70 | Ht 66.0 in | Wt 127.7 lb

## 2021-08-07 DIAGNOSIS — Z0000 Encounter for general adult medical examination without abnormal findings: Secondary | ICD-10-CM | POA: Diagnosis not present

## 2021-08-07 NOTE — Progress Notes (Signed)
? ?Subjective:  ? Stacey [name] is a 76 y.o. female who presents for Medicare Annual (Subsequent) preventive examination. ? ?Review of Systems    ? ?  ? ?   ?Objective:  ?  ?Today's Vitals  ? 08/07/21 1318  ?BP: 110/70  ?Weight: 127 lb 11.2 oz (57.9 kg)  ?Height: '5\' 6"'$  (1.676 m)  ? ?Body mass index is 20.61 kg/m?. ? ? ?  11/10/2016  ?  3:19 PM 02/04/2015  ?  7:30 PM  ?Advanced Directives  ?Does Patient Have a Medical Advance Directive? Yes Yes  ?Type of Paramedic of [place];Living will Living will  ?Does patient want to make changes to medical advance directive?  No - Patient declined  ?Copy of [place] in Chart? No - copy requested No - copy requested  ? ? ?Current Medications (verified) ?Outpatient Encounter Medications as of 08/07/2021  ?Medication Sig  ? acetaminophen (TYLENOL) 325 MG tablet Take 2 tablets (650 mg total) by mouth every 6 (six) hours as needed for mild pain (or Fever >/= 101).  ? Calcium Citrate-Vitamin D (CALCIUM + D PO) Take 1 tablet by mouth daily.  ? Cholecalciferol (VITAMIN D-3) 1000 UNITS CAPS Take 1 capsule by mouth daily.  ? Coenzyme Q10 (COQ-10) 100 MG CAPS Take 100 mg by mouth daily.  ? donepezil (ARICEPT) 5 MG tablet TAKE ONE TABLET BY MOUTH EVERY NIGHT AT BEDTIME  ? metoprolol succinate (TOPROL-XL) 25 MG 24 hr tablet TAKE ONE TABLET BY MOUTH DAILY  ? Omega-3 Fatty Acids (FISH OIL PO) Take 300 mg by mouth.  ? pravastatin (PRAVACHOL) 20 MG tablet TAKE ONE TABLET BY MOUTH DAILY  ? Red Yeast Rice Extract (RED YEAST RICE PO) Take 600 mg by mouth daily.   ? Turmeric Curcumin 500 MG CAPS Take 500 mg by mouth daily.  ? vitamin C (ASCORBIC ACID) 500 MG tablet Take 1,000 mg by mouth daily.   ? ?No facility-administered encounter medications on file as of 08/07/2021.  ? ? ?Allergies (verified) ?Patient has no known allergies.  ? ?History: ?Past Medical History:  ?Diagnosis Date  ? Hyperlipidemia   ? Hypertension   ? Olecranon bursitis   ? left   ?  Osteoporosis   ? ?Past Surgical History:  ?Procedure Laterality Date  ? ABDOMINAL HYSTERECTOMY    ? INTRAMEDULLARY (IM) NAIL INTERTROCHANTERIC Right 02/05/2015  ? Procedure: INTRAMEDULLARY (IM) NAIL INTERTROCHANTRIC;  Surgeon: Corky Mull, MD;  Location: [facility] ORS;  Service: Orthopedics;  Laterality: Right;  ? ?Family History  ?Problem Relation Age of Onset  ? Cancer Mother   ?     colon  ? Cancer Father   ?     lung cancer  ? ?Social History  ? ?Socioeconomic History  ? Marital status: Married  ?  Spouse name: Not on file  ? Number of children: Not on file  ? Years of education: Not on file  ? Highest education level: Not on file  ?Occupational History  ? Not on file  ?Tobacco Use  ? Smoking status: Never  ? Smokeless tobacco: Never  ?Substance and Sexual Activity  ? Alcohol use: No  ? Drug use: No  ? Sexual activity: Not Currently  ?Other Topics Concern  ? Not on file  ?Social History Narrative  ? Not on file  ? ?Social Determinants of Health  ? ?Financial Resource Strain: Not on file  ?Food Insecurity: Not on file  ?Transportation Needs: Not on file  ?Physical Activity:  Not on file  ?Stress: Not on file  ?Social Connections: Not on file  ? ? ?Tobacco Counseling ?Counseling given: Not Answered ? ? ?Clinical Intake: ? ?Pre-visit preparation completed: Yes ? ?Pain : No/denies pain ? ?  ? ?Nutritional Risks: None ?Diabetes: No ? ?How often do you need to have someone help you when you read instructions, pamphlets, or other written materials from your doctor or pharmacy?: 1 - Never ? ?Diabetic?no ? ?Interpreter Needed?: No ? ?Information entered by :: Kirke Shaggy, LPN ? ? ?Activities of Daily Living ? ?  07/15/2021  ?  2:25 PM 09/12/2020  ?  2:20 PM  ?In your present state of health, do you have any difficulty performing the following activities:  ?Hearing? 0 1  ?Vision? 0 0  ?Difficulty concentrating or making decisions? 0 0  ?Walking or climbing stairs? 0 0  ?Dressing or bathing? 0 0  ?Doing errands, shopping? 0 0   ? ? ?Patient Care Team: ?Emelia Loron as PCP - General (Physician Assistant) ?Anell Barr, OD as Consulting Physician (Optometry) ?Ralene Bathe, MD as Consulting Physician (Dermatology) ? ?Indicate any recent Medical Services you may have received from other than [facility] providers in the past year (date may be approximate). ? ?   ?Assessment:  ? This is a routine wellness examination for [facility]. ? ?Hearing/Vision screen ?No results found. ? ?Dietary issues and exercise activities discussed: ?  ? ? Goals Addressed   ?None ?  ? ?Depression Screen ? ?  07/15/2021  ?  2:24 PM 09/12/2020  ?  2:21 PM 11/10/2016  ?  3:19 PM 10/29/2016  ? 10:55 AM  ?PHQ 2/9 Scores  ?PHQ - 2 Score 0 0 0 0  ?PHQ- 9 Score 0 0    ?  ?Fall Risk ? ?  07/15/2021  ?  2:24 PM 09/12/2020  ?  2:22 PM 02/22/2019  ?  9:52 AM 01/06/2018  ?  9:47 AM 11/10/2016  ?  3:19 PM  ?Fall Risk   ?Falls in the past year? 1 0 1 No No  ?Comment   [facility] Telephone Survey: data to providers prior to load    ?Number falls in past yr: 1 0 1    ?Comment   [facility] Telephone Survey Actual Response = 1    ?Injury with Fall? 1 0 1    ?Risk for fall due to : History of fall(s) No Fall Risks     ?Follow up  Falls evaluation completed     ? ? ?FALL RISK PREVENTION PERTAINING TO THE HOME: ? ?Any stairs in or around the home? Yes  ?If so, are there any without handrails? No  ?Home free of loose throw rugs in walkways, pet beds, electrical cords, etc? Yes  ?Adequate lighting in your home to reduce risk of falls? Yes  ? ?ASSISTIVE DEVICES UTILIZED TO PREVENT FALLS: ? ?Life alert? No  ?Use of a cane, walker or w/c? No  ?Grab bars in the bathroom? Yes  ?Shower chair or bench in shower? No  ?Elevated toilet seat or a handicapped toilet? Yes  ? ?TIMED UP AND GO: ? ?Was the test performed? Yes .  ?Length of time to ambulate 10 feet: 4 sec.  ? ?Gait steady and fast without use of assistive device ? ?Cognitive Function: ? ?  08/21/2019  ?  9:11 AM  ?MMSE - Mini Mental State Exam   ?Orientation to time 4  ?Orientation to Place 5  ?Registration 3  ?Attention/  Calculation 5  ?Recall 0  ?Language- name 2 objects 2  ?Language- repeat 1  ?Language- follow 3 step command 2  ?Language- read & follow direction 1  ?Write a sentence 1  ?Copy design 1  ?Total score 25  ? ?  ?  ? ?Immunizations ?Immunization History  ?Administered Date(s) Administered  ? PFIZER(Purple Top)SARS-COV-2 Vaccination 05/31/2019, 06/18/2019  ? Td 03/11/2009  ? Tdap 06/05/2020  ? ? ?TDAP status: Up to date ? ?Flu Vaccine status: Declined, Education has been provided regarding the importance of this vaccine but patient still declined. Advised may receive this vaccine at local pharmacy or Health Dept. Aware to provide a copy of the vaccination record if obtained from local pharmacy or Health Dept. Verbalized acceptance and understanding. ? ?Pneumococcal vaccine status: Declined,  Education has been provided regarding the importance of this vaccine but patient still declined. Advised may receive this vaccine at local pharmacy or Health Dept. Aware to provide a copy of the vaccination record if obtained from local pharmacy or Health Dept. Verbalized acceptance and understanding.  ? ?Covid-19 vaccine status: Completed vaccines ? ?Qualifies for Shingles Vaccine? Yes   ?Zostavax completed No   ?Shingrix Completed?: No.    Education has been provided regarding the importance of this vaccine. Patient has been advised to call insurance company to determine out of pocket expense if they have not yet received this vaccine. Advised may also receive vaccine at local pharmacy or Health Dept. Verbalized acceptance and understanding. ? ?Screening Tests ?Health Maintenance  ?Topic Date Due  ? Zoster Vaccines- Shingrix (1 of 2) Never done  ? Pneumonia Vaccine 65+ Years old (1 - PCV) Never done  ? COVID-19 Vaccine (3 - Pfizer risk series) 07/16/2019  ? INFLUENZA VACCINE  10/28/2021  ? TETANUS/TDAP  06/06/2030  ? DEXA SCAN  Completed  ? Hepatitis C  Screening  Completed  ? HPV VACCINES  Aged Out  ? Fecal DNA (Cologuard)  Discontinued  ? ? ?Health Maintenance ? ?Health Maintenance Due  ?Topic Date Due  ? Zoster Vaccines- Shingrix (1 of 2) Never done  ? Pneumon

## 2021-08-07 NOTE — Patient Instructions (Signed)
Ms. Ruane , ?Thank you for taking time to come for your Medicare Wellness Visit. I appreciate your ongoing commitment to your health goals. Please review the following plan we discussed and let me know if I can assist you in the future.  ? ?Screening recommendations/referrals: ?Colonoscopy: aged out  ?Mammogram: aged out ?Bone Density: 12/01/18 ?Recommended yearly ophthalmology/optometry visit for glaucoma screening and checkup ?Recommended yearly dental visit for hygiene and checkup ? ?Vaccinations: ?Influenza vaccine: n/d ?Pneumococcal vaccine: n/d ?Tdap vaccine: 06/05/20 ?Shingles vaccine: n/d   ?Covid-19:05/31/19, 06/18/19 ? ?Advanced directives: no ? ?Conditions/risks identified: none ? ?Next appointment: Follow up in one year for your annual wellness visit 08/13/22 @ 1pm in person ? ? ?Preventive Care 4 Years and Older, Female ?Preventive care refers to lifestyle choices and visits with your health care provider that can promote health and wellness. ?What does preventive care include? ?A yearly physical exam. This is also called an annual well check. ?Dental exams once or twice a year. ?Routine eye exams. Ask your health care provider how often you should have your eyes checked. ?Personal lifestyle choices, including: ?Daily care of your teeth and gums. ?Regular physical activity. ?Eating a healthy diet. ?Avoiding tobacco and drug use. ?Limiting alcohol use. ?Practicing safe sex. ?Taking low-dose aspirin every day. ?Taking vitamin and mineral supplements as recommended by your health care provider. ?What happens during an annual well check? ?The services and screenings done by your health care provider during your annual well check will depend on your age, overall health, lifestyle risk factors, and family history of disease. ?Counseling  ?Your health care provider may ask you questions about your: ?Alcohol use. ?Tobacco use. ?Drug use. ?Emotional well-being. ?Home and relationship well-being. ?Sexual  activity. ?Eating habits. ?History of falls. ?Memory and ability to understand (cognition). ?Work and work Statistician. ?Reproductive health. ?Screening  ?You may have the following tests or measurements: ?Height, weight, and BMI. ?Blood pressure. ?Lipid and cholesterol levels. These may be checked every 5 years, or more frequently if you are over 76 years old. ?Skin check. ?Lung cancer screening. You may have this screening every year starting at age 56 if you have a 30-pack-year history of smoking and currently smoke or have quit within the past 15 years. ?Fecal occult blood test (FOBT) of the stool. You may have this test every year starting at age 33. ?Flexible sigmoidoscopy or colonoscopy. You may have a sigmoidoscopy every 5 years or a colonoscopy every 10 years starting at age 42. ?Hepatitis C blood test. ?Hepatitis B blood test. ?Sexually transmitted disease (STD) testing. ?Diabetes screening. This is done by checking your blood sugar (glucose) after you have not eaten for a while (fasting). You may have this done every 1-3 years. ?Bone density scan. This is done to screen for osteoporosis. You may have this done starting at age 44. ?Mammogram. This may be done every 1-2 years. Talk to your health care provider about how often you should have regular mammograms. ?Talk with your health care provider about your test results, treatment options, and if necessary, the need for more tests. ?Vaccines  ?Your health care provider may recommend certain vaccines, such as: ?Influenza vaccine. This is recommended every year. ?Tetanus, diphtheria, and acellular pertussis (Tdap, Td) vaccine. You may need a Td booster every 10 years. ?Zoster vaccine. You may need this after age 4. ?Pneumococcal 13-valent conjugate (PCV13) vaccine. One dose is recommended after age 40. ?Pneumococcal polysaccharide (PPSV23) vaccine. One dose is recommended after age 73. ?Talk to your health  care provider about which screenings and vaccines  you need and how often you need them. ?This information is not intended to replace advice given to you by your health care provider. Make sure you discuss any questions you have with your health care provider. ?Document Released: 04/12/2015 Document Revised: 12/04/2015 Document Reviewed: 01/15/2015 ?Elsevier Interactive Patient Education ? 2017 [place]. ? ?Fall Prevention in the Home ?Falls can cause injuries. They can happen to people of all ages. There are many things you can do to make your home safe and to help prevent falls. ?What can I do on the outside of my home? ?Regularly fix the edges of walkways and driveways and fix any cracks. ?Remove anything that might make you trip as you walk through a door, such as a raised step or threshold. ?Trim any bushes or trees on the path to your home. ?Use bright outdoor lighting. ?Clear any walking paths of anything that might make someone trip, such as rocks or tools. ?Regularly check to see if handrails are loose or broken. Make sure that both sides of any steps have handrails. ?Any raised decks and porches should have guardrails on the edges. ?Have any leaves, snow, or ice cleared regularly. ?Use sand or salt on walking paths during winter. ?Clean up any spills in your garage right away. This includes oil or grease spills. ?What can I do in the bathroom? ?Use night lights. ?Install grab bars by the toilet and in the tub and shower. Do not use towel bars as grab bars. ?Use non-skid mats or decals in the tub or shower. ?If you need to sit down in the shower, use a plastic, non-slip stool. ?Keep the floor dry. Clean up any water that spills on the floor as soon as it happens. ?Remove soap buildup in the tub or shower regularly. ?Attach bath mats securely with double-sided non-slip rug tape. ?Do not have throw rugs and other things on the floor that can make you trip. ?What can I do in the bedroom? ?Use night lights. ?Make sure that you have a light by your bed that  is easy to reach. ?Do not use any sheets or blankets that are too big for your bed. They should not hang down onto the floor. ?Have a firm chair that has side arms. You can use this for support while you get dressed. ?Do not have throw rugs and other things on the floor that can make you trip. ?What can I do in the kitchen? ?Clean up any spills right away. ?Avoid walking on wet floors. ?Keep items that you use a lot in easy-to-reach places. ?If you need to reach something above you, use a strong step stool that has a grab bar. ?Keep electrical cords out of the way. ?Do not use floor polish or wax that makes floors slippery. If you must use wax, use non-skid floor wax. ?Do not have throw rugs and other things on the floor that can make you trip. ?What can I do with my stairs? ?Do not leave any items on the stairs. ?Make sure that there are handrails on both sides of the stairs and use them. Fix handrails that are broken or loose. Make sure that handrails are as long as the stairways. ?Check any carpeting to make sure that it is firmly attached to the stairs. Fix any carpet that is loose or worn. ?Avoid having throw rugs at the top or bottom of the stairs. If you do have throw rugs, attach them to  the floor with carpet tape. ?Make sure that you have a light switch at the top of the stairs and the bottom of the stairs. If you do not have them, ask someone to add them for you. ?What else can I do to help prevent falls? ?Wear shoes that: ?Do not have high heels. ?Have rubber bottoms. ?Are comfortable and fit you well. ?Are closed at the toe. Do not wear sandals. ?If you use a stepladder: ?Make sure that it is fully opened. Do not climb a closed stepladder. ?Make sure that both sides of the stepladder are locked into place. ?Ask someone to hold it for you, if possible. ?Clearly mark and make sure that you can see: ?Any grab bars or handrails. ?First and last steps. ?Where the edge of each step is. ?Use tools that help you  move around (mobility aids) if they are needed. These include: ?Canes. ?Walkers. ?Scooters. ?Crutches. ?Turn on the lights when you go into a dark area. Replace any light bulbs as soon as they burn out. ?Set up

## 2021-09-03 ENCOUNTER — Encounter: Payer: Self-pay | Admitting: Physician Assistant

## 2021-09-03 ENCOUNTER — Ambulatory Visit (INDEPENDENT_AMBULATORY_CARE_PROVIDER_SITE_OTHER): Payer: PPO | Admitting: Physician Assistant

## 2021-09-03 VITALS — BP 141/100 | HR 66 | Temp 98.1°F | Resp 16 | Wt 126.0 lb

## 2021-09-03 DIAGNOSIS — M25561 Pain in right knee: Secondary | ICD-10-CM | POA: Diagnosis not present

## 2021-09-03 DIAGNOSIS — I10 Essential (primary) hypertension: Secondary | ICD-10-CM

## 2021-09-03 DIAGNOSIS — M25551 Pain in right hip: Secondary | ICD-10-CM | POA: Diagnosis not present

## 2021-09-03 MED ORDER — LISINOPRIL 5 MG PO TABS: 5.0000 mg | ORAL_TABLET | Freq: Every day | ORAL | 3 refills | Status: DC

## 2021-09-03 NOTE — Progress Notes (Signed)
I,Sulibeya S Dimas,acting as a Education administrator for [employer], PA-C.,have documented all relevant documentation on the behalf of Stacey Speak, PA-C,as directed by  [employer], PA-C while in the presence of [employer], PA-C.   Established patient visit   Patient: Stacey [name]   DOB: 28-Jan-1946   76 y.o. Female  MRN: [id number] Visit Date: 09/03/2021  Today's healthcare provider: Mardene Speak, PA-C   Chief Complaint  Patient presents with   Hip Pain/Knee pain   Subjective    Stacey [name] is a 76 yr old female presenting for right hip pain and knee x few days.  HIP/knee PAIN Duration: days, broke her rip a mo ago Involved hip/knee: right  Mechanism of injury: unknown Location: lateral, posterior Onset: gradual  Severity: 5/10  Quality: dull, shooting,  and throbbing Frequency: standing or walking Radiation: no Aggravating factors: weight bearing, walking, running, stairs, bending, and movement   Alleviating factors: APAP and rest  Status: worse Treatments attempted: rest and APAP   Relief with NSAIDs?: No NSAIDs Taken Weakness with weight bearing: yes Weakness with walking: yes Paresthesias / decreased sensation: numbness Swelling: very mild Redness:no  Sensation of giving way: occasionally Locking: yes Popping: no Bruising: no  Medications: Outpatient Medications Prior to Visit  Medication Sig   acetaminophen (TYLENOL) 325 MG tablet Take 2 tablets (650 mg total) by mouth every 6 (six) hours as needed for mild pain (or Fever >/= 101).   Ascorbic Acid 500 MG CHEW Chew by mouth.   b complex vitamins capsule Take 1 capsule by mouth daily.   Calcium Citrate-Vitamin D (CALCIUM + D PO) Take 1 tablet by mouth daily.   donepezil (ARICEPT) 5 MG tablet TAKE ONE TABLET BY MOUTH EVERY NIGHT AT BEDTIME   metoprolol succinate (TOPROL-XL) 25 MG 24 hr tablet TAKE ONE TABLET BY MOUTH DAILY   Multiple Vitamin (MULTI-VITAMIN DAILY PO) Take 1 tablet by mouth daily.    pravastatin (PRAVACHOL) 20 MG tablet TAKE ONE TABLET BY MOUTH DAILY   [DISCONTINUED] Cholecalciferol (VITAMIN D-3) 1000 UNITS CAPS Take 1 capsule by mouth daily. (Patient not taking: Reported on 09/03/2021)   [DISCONTINUED] Coenzyme Q10 (COQ-10) 100 MG CAPS Take 100 mg by mouth daily. (Patient not taking: Reported on 09/03/2021)   [DISCONTINUED] Omega-3 Fatty Acids (FISH OIL PO) Take 300 mg by mouth.   [DISCONTINUED] Red Yeast Rice Extract (RED YEAST RICE PO) Take 600 mg by mouth daily.    [DISCONTINUED] Turmeric Curcumin 500 MG CAPS Take 500 mg by mouth daily.   [DISCONTINUED] vitamin C (ASCORBIC ACID) 500 MG tablet Take 1,000 mg by mouth daily.    No facility-administered medications prior to visit.    Review of Systems  All other systems reviewed and are negative. Except HPI    Objective    BP (!) 141/100 (BP Location: Right Arm, Patient Position: Sitting, Cuff Size: Normal)   Pulse 66   Temp 98.1 F (36.7 C) (Oral)   Resp 16   Wt 126 lb (57.2 kg)   SpO2 100%   BMI 20.34 kg/m    Physical Exam Vitals reviewed.  Constitutional:      Appearance: Normal appearance. She is normal weight.  HENT:     Head: Normocephalic and atraumatic.  Cardiovascular:     Pulses: Normal pulses.  Pulmonary:     Effort: Pulmonary effort is normal.  Musculoskeletal:        General: Tenderness present. No deformity.     Right lower leg: No edema.  Left lower leg: No edema.     Comments: Corky Sox, fadir test negative Straight raise leg test negative Anterior/posterior drawers test negative Positive Noble compression test Mcmurray test negative No erythema or ecchymosis noted R knee feels slightly swollen vs L knee   Neurological:     Mental Status: She is alert and oriented to person, place, and time.     Motor: Weakness present.     Gait: Gait abnormal.  Psychiatric:        Behavior: Behavior normal.        Thought Content: Thought content normal.        Judgment: Judgment normal.      No results found for any visits on 09/03/21.  Assessment & Plan     1. Acute latero posterior pain of right knee  DDX ITBS, PFP, LM injury, tendinopathy, OA, LCL injury, fibular head subluxation   TREATMENT Hx of fall, Pt does not remember if she hit her hip at the time of injury Has a dementia. Has an osteoporosis  - DG Knee Complete 4 Views Right; Future - DG HIP UNILAT W OR W/O PELVIS 2-3 VIEWS RIGHT; Future - Ambulatory referral to Physical Therapy - Ambulatory referral to Home Health  2. Pain of right hip Hx of fall, Pt does not remember if she hit her hip at the time of injury Has a dementia. Has an osteoporosis  - DG Knee Complete 4 Views Right; Future - DG HIP UNILAT W OR W/O PELVIS 2-3 VIEWS RIGHT; Future - Ambulatory referral to Home Health  3. Primary hypertension BP has been in 140/100 Continue to take metoprolol - lisinopril (ZESTRIL) 5 MG tablet; Take 1 tablet (5 mg total) by mouth daily.  Dispense: 90 tablet; Refill: 3   FU in 6 weeks with her PCP     The patient was advised to call back or seek an in-person evaluation if the symptoms worsen or if the condition fails to improve as anticipated.  I discussed the assessment and treatment plan with the patient. The patient was provided an opportunity to ask questions and all were answered. The patient agreed with the plan and demonstrated an understanding of the instructions.  The entirety of the information documented in the History of Present Illness, Review of Systems and Physical Exam were personally obtained by me. Portions of this information were initially documented by the CMA and reviewed by me for thoroughness and accuracy.  Portions of this note were created using dictation software and may contain typographical errors.       Total encounter time more than 30 minutes  Greater than 50% was spent in counseling and coordination of care with the patient    Elberta Leatherwood  [facility] [phone] (phone) [phone] (fax)  [place]

## 2021-09-05 ENCOUNTER — Ambulatory Visit
Admission: RE | Admit: 2021-09-05 | Discharge: 2021-09-05 | Disposition: A | Discharge status: Home / Self Care | Payer: PPO | Source: Ambulatory Visit | Attending: Physician Assistant | Admitting: Physician Assistant

## 2021-09-05 ENCOUNTER — Ambulatory Visit
Admission: RE | Admit: 2021-09-05 | Discharge: 2021-09-05 | Disposition: A | Discharge status: Home / Self Care | Payer: PPO | Attending: Physician Assistant | Admitting: Physician Assistant

## 2021-09-05 DIAGNOSIS — M25551 Pain in right hip: Secondary | ICD-10-CM | POA: Diagnosis present

## 2021-09-05 DIAGNOSIS — M25561 Pain in right knee: Secondary | ICD-10-CM

## 2021-09-12 ENCOUNTER — Telehealth: Payer: Self-pay

## 2021-09-12 NOTE — Telephone Encounter (Signed)
Copied from [place] [phone]. Topic: General - Other >> Sep 12, 2021 11:28 AM Leitha Schuller wrote: Reason for CRM: Daughter calling to check status of patients 6-09 lab results, daughter states patient is unable to access her mychart for results, daughter is not listed on dpr,   Mychart was reset, informed daughter to have patient create another user name/ password and if that does not work, have patient call back for lab results

## 2021-10-16 NOTE — Progress Notes (Deleted)
      Established patient visit   Patient: Stacey [name]   DOB: 1945/04/18   76 y.o. Female  MRN: [id number] Visit Date: 10/17/2021  Today's healthcare provider: [name], PA-C   No chief complaint on file.  Subjective    HPI  Follow up for hip and knee pain  The patient was last seen for this 6 weeks ago. Changes made at last visit include ***.  She reports {excellent/good/fair/poor:19665} compliance with treatment. She feels that condition is {improved/worse/unchanged:3041574}. She {is/is not:21021397} having side effects. ***  -----------------------------------------------------------------------------------------   Medications: Outpatient Medications Prior to Visit  Medication Sig   acetaminophen (TYLENOL) 325 MG tablet Take 2 tablets (650 mg total) by mouth every 6 (six) hours as needed for mild pain (or Fever >/= 101).   Ascorbic Acid 500 MG CHEW Chew by mouth.   b complex vitamins capsule Take 1 capsule by mouth daily.   Calcium Citrate-Vitamin D (CALCIUM + D PO) Take 1 tablet by mouth daily.   donepezil (ARICEPT) 5 MG tablet TAKE ONE TABLET BY MOUTH EVERY NIGHT AT BEDTIME   lisinopril (ZESTRIL) 5 MG tablet Take 1 tablet (5 mg total) by mouth daily.   metoprolol succinate (TOPROL-XL) 25 MG 24 hr tablet TAKE ONE TABLET BY MOUTH DAILY   Multiple Vitamin (MULTI-VITAMIN DAILY PO) Take 1 tablet by mouth daily.   pravastatin (PRAVACHOL) 20 MG tablet TAKE ONE TABLET BY MOUTH DAILY   No facility-administered medications prior to visit.    Review of Systems  {Labs  Heme  Chem  Endocrine  Serology  Results Review (optional):23779}   Objective    There were no vitals taken for this visit. {Show previous vital signs (optional):23777}  Physical Exam  ***  No results found for any visits on 10/17/21.  Assessment & Plan     ***  No follow-ups on file.      {provider attestation***:1}   [name], PA-C  [facility] [phone] (phone) [phone] (fax)  [employer]

## 2021-10-17 ENCOUNTER — Ambulatory Visit: Payer: PPO | Admitting: Physician Assistant

## 2021-10-20 ENCOUNTER — Telehealth: Payer: Self-pay | Admitting: Physician Assistant

## 2021-10-20 ENCOUNTER — Other Ambulatory Visit: Payer: Self-pay

## 2021-10-20 DIAGNOSIS — I10 Essential (primary) hypertension: Secondary | ICD-10-CM

## 2021-10-20 MED ORDER — METOPROLOL SUCCINATE ER 25 MG PO TB24: 25.0000 mg | ORAL_TABLET | Freq: Every day | ORAL | 0 refills | Status: DC

## 2021-10-20 NOTE — Telephone Encounter (Signed)
[facility] faxed refill request for the following medications:  metoprolol succinate (TOPROL-XL) 25 MG 24 hr tablet   Please advise.

## 2021-10-23 ENCOUNTER — Other Ambulatory Visit: Payer: Self-pay | Admitting: Family Medicine

## 2021-10-23 DIAGNOSIS — E782 Mixed hyperlipidemia: Secondary | ICD-10-CM

## 2021-11-13 NOTE — Progress Notes (Signed)
I,Sha'taria Tyson,acting as a Education administrator for [employer], PA-C.,have documented all relevant documentation on the behalf of Stacey Kirschner, PA-C,as directed by  Stacey Kirschner, PA-C while in the presence of Stacey Kirschner, PA-C.   Established patient visit   Patient: Stacey [name]   DOB: February 18, 1946   76 y.o. Female  MRN: [id number] Visit Date: 11/14/2021  Today's healthcare provider: [name], PA-C   No chief complaint on file.  Subjective    HPI  Stable slightly hypotensive with addition of lisinopril 5 check at home and if low cut to 2.5  Signed paperwork for facility -- tb and cmp to check kidnies Hypertension, follow-up  BP Readings from Last 3 Encounters:  09/03/21 (!) 141/100  08/07/21 110/70  07/15/21 (!) 140/101   Wt Readings from Last 3 Encounters:  09/03/21 126 lb (57.2 kg)  08/07/21 127 lb 11.2 oz (57.9 kg)  07/15/21 129 lb 1.6 oz (58.6 kg)     She was last seen for hypertension 2 months ago.  BP at that visit was 141/100. Management since that visit includes no changes.  She reports excellent compliance with treatment. She is not having side effects. {document side effects if present:1} She is following a Regular diet. She is exercising. She does not smoke.  Use of agents associated with hypertension: none.   Outside blood pressures are {not being checked}. Symptoms: No chest pain No chest pressure  No palpitations No syncope  No dyspnea No orthopnea  No paroxysmal nocturnal dyspnea No lower extremity edema   Pertinent labs Lab Results  Component Value Date   CHOL 174 07/15/2021   HDL 53 07/15/2021   LDLCALC 93 07/15/2021   TRIG 164 (H) 07/15/2021   CHOLHDL 3.3 07/15/2021   Lab Results  Component Value Date   NA 144 07/15/2021   K 3.9 07/15/2021   CREATININE 1.15 (H) 07/15/2021   EGFR 49 (L) 07/15/2021   GLUCOSE 90 07/15/2021   TSH 2.180 04/25/2020     The 10-year ASCVD risk score (Arnett DK, et al., 2019) is:  30.1%  --------------------------------------------------------------------------------------------------- Lipid/Cholesterol, Follow-up  Last lipid panel Other pertinent labs  Lab Results  Component Value Date   CHOL 174 07/15/2021   HDL 53 07/15/2021   LDLCALC 93 07/15/2021   TRIG 164 (H) 07/15/2021   CHOLHDL 3.3 07/15/2021   Lab Results  Component Value Date   ALT 10 07/15/2021   AST 20 07/15/2021   PLT 182 07/15/2021   TSH 2.180 04/25/2020     She was last seen for this 4 months ago.  Management since that visit includes no changes.  She reports excellent compliance with treatment. She is not having side effects. {document side effects if present:1}  Symptoms: No chest pain No chest pressure/discomfort  No dyspnea No lower extremity edema  No numbness or tingling of extremity No orthopnea  No palpitations No paroxysmal nocturnal dyspnea  No speech difficulty No syncope   Current diet: in general, a "healthy" diet   Current exercise: walking  The 10-year ASCVD risk score (Arnett DK, et al., 2019) is: 30.1%  ---------------------------------------------------------------------------------------------------   Medications: Outpatient Medications Prior to Visit  Medication Sig  . acetaminophen (TYLENOL) 325 MG tablet Take 2 tablets (650 mg total) by mouth every 6 (six) hours as needed for mild pain (or Fever >/= 101).  . Ascorbic Acid 500 MG CHEW Chew by mouth.  Marland Kitchen b complex vitamins capsule Take 1 capsule by mouth daily.  . Calcium Citrate-Vitamin D (  CALCIUM + D PO) Take 1 tablet by mouth daily.  Marland Kitchen donepezil (ARICEPT) 5 MG tablet TAKE ONE TABLET BY MOUTH EVERY NIGHT AT BEDTIME  . lisinopril (ZESTRIL) 5 MG tablet Take 1 tablet (5 mg total) by mouth daily.  . metoprolol succinate (TOPROL-XL) 25 MG 24 hr tablet Take 1 tablet (25 mg total) by mouth daily.  . Multiple Vitamin (MULTI-VITAMIN DAILY PO) Take 1 tablet by mouth daily.  . pravastatin (PRAVACHOL) 20 MG tablet  TAKE ONE TABLET BY MOUTH DAILY   No facility-administered medications prior to visit.    Review of Systems  {Labs  Heme  Chem  Endocrine  Serology  Results Review (optional):23779}   Objective    There were no vitals taken for this visit. {Show previous vital signs (optional):23777}  Physical Exam  ***  No results found for any visits on 11/14/21.  Assessment & Plan     ***  No follow-ups on file.      {provider attestation***:1}   Stacey Kirschner, PA-C  [facility] [phone] (phone) [phone] (fax)  [place]

## 2021-11-14 ENCOUNTER — Ambulatory Visit (INDEPENDENT_AMBULATORY_CARE_PROVIDER_SITE_OTHER): Payer: PPO | Admitting: Physician Assistant

## 2021-11-14 ENCOUNTER — Encounter: Payer: Self-pay | Admitting: Physician Assistant

## 2021-11-14 VITALS — BP 96/72 | HR 74 | Wt 129.6 lb

## 2021-11-14 DIAGNOSIS — Z111 Encounter for screening for respiratory tuberculosis: Secondary | ICD-10-CM

## 2021-11-14 DIAGNOSIS — I10 Essential (primary) hypertension: Secondary | ICD-10-CM

## 2021-11-14 DIAGNOSIS — E782 Mixed hyperlipidemia: Secondary | ICD-10-CM

## 2021-11-14 NOTE — Assessment & Plan Note (Addendum)
Currently managed with metoprolol 25 mg daily and lisinopril 5 mg daily;  Slightly low in office, less hypotensive on repeat, advised to check at home and if <110/70 consider decreasing lisinopril to 2.5 mg Daughter is a Marine scientist and will monitor, but pt is asymptomatic

## 2021-11-17 ENCOUNTER — Encounter: Payer: Self-pay | Admitting: Physician Assistant

## 2021-11-17 LAB — QUANTIFERON-TB GOLD PLUS
QuantiFERON Mitogen Value: 6.26 IU/mL
QuantiFERON Nil Value: 0.01 IU/mL
QuantiFERON TB1 Ag Value: 0.02 IU/mL
QuantiFERON TB2 Ag Value: 0.02 IU/mL
QuantiFERON-TB Gold Plus: NEGATIVE

## 2021-11-17 LAB — COMPREHENSIVE METABOLIC PANEL
ALT: 13 IU/L (ref 0–32)
AST: 18 IU/L (ref 0–40)
Albumin/Globulin Ratio: 2.1 (ref 1.2–2.2)
Albumin: 4.2 g/dL (ref 3.8–4.8)
Alkaline Phosphatase: 94 IU/L (ref 44–121)
BUN/Creatinine Ratio: 18 (ref 12–28)
BUN: 23 mg/dL (ref 8–27)
Bilirubin Total: 0.4 mg/dL (ref 0.0–1.2)
CO2: 24 mmol/L (ref 20–29)
Calcium: 10.3 mg/dL (ref 8.7–10.3)
Chloride: 106 mmol/L (ref 96–106)
Creatinine, Ser: 1.3 mg/dL — ABNORMAL HIGH (ref 0.57–1.00)
Globulin, Total: 2 g/dL (ref 1.5–4.5)
Glucose: 84 mg/dL (ref 70–99)
Potassium: 4.4 mmol/L (ref 3.5–5.2)
Sodium: 146 mmol/L — ABNORMAL HIGH (ref 134–144)
Total Protein: 6.2 g/dL (ref 6.0–8.5)
eGFR: 42 mL/min/{1.73_m2} — ABNORMAL LOW (ref >59)

## 2022-01-01 ENCOUNTER — Telehealth: Payer: Self-pay

## 2022-01-01 DIAGNOSIS — I10 Essential (primary) hypertension: Secondary | ICD-10-CM

## 2022-01-01 NOTE — Telephone Encounter (Signed)
Advised verbalized understanding. Labs printed and placed upfront

## 2022-01-01 NOTE — Addendum Note (Signed)
Addended by: Barnie Mort on: 01/01/2022 01:23 PM   Modules accepted: Orders

## 2022-01-01 NOTE — Addendum Note (Signed)
Addended by: Barnie Mort on: 01/01/2022 01:50 PM   Modules accepted: Orders

## 2022-01-01 NOTE — Telephone Encounter (Signed)
Copied from [place] [phone]. Topic: Appointment Scheduling - Scheduling Inquiry for Clinic >> Jan 01, 2022 12:14 PM Penni Bombard wrote: Reason for CRM: [name] the daughter of the pt called asking if her mother just needs labs or needs to see Mendel Ryder.  She said the last time she was in they told her she needed to have her labs redone.  CB@  [phone]

## 2022-01-03 LAB — COMPREHENSIVE METABOLIC PANEL
ALT: 14 IU/L (ref 0–32)
AST: 21 IU/L (ref 0–40)
Albumin/Globulin Ratio: 2.8 — ABNORMAL HIGH (ref 1.2–2.2)
Albumin: 4.2 g/dL (ref 3.8–4.8)
Alkaline Phosphatase: 85 IU/L (ref 44–121)
BUN/Creatinine Ratio: 20 (ref 12–28)
BUN: 21 mg/dL (ref 8–27)
Bilirubin Total: 0.4 mg/dL (ref 0.0–1.2)
CO2: 23 mmol/L (ref 20–29)
Calcium: 9.8 mg/dL (ref 8.7–10.3)
Chloride: 106 mmol/L (ref 96–106)
Creatinine, Ser: 1.05 mg/dL — ABNORMAL HIGH (ref 0.57–1.00)
Globulin, Total: 1.5 g/dL (ref 1.5–4.5)
Glucose: 83 mg/dL (ref 70–99)
Potassium: 4.4 mmol/L (ref 3.5–5.2)
Sodium: 142 mmol/L (ref 134–144)
Total Protein: 5.7 g/dL — ABNORMAL LOW (ref 6.0–8.5)
eGFR: 55 mL/min/{1.73_m2} — ABNORMAL LOW (ref >59)

## 2022-01-15 ENCOUNTER — Telehealth: Payer: Self-pay | Admitting: Physician Assistant

## 2022-01-15 DIAGNOSIS — E782 Mixed hyperlipidemia: Secondary | ICD-10-CM

## 2022-01-15 DIAGNOSIS — I10 Essential (primary) hypertension: Secondary | ICD-10-CM

## 2022-01-15 MED ORDER — METOPROLOL SUCCINATE ER 25 MG PO TB24: 25.0000 mg | ORAL_TABLET | Freq: Every day | ORAL | 1 refills | Status: DC

## 2022-01-15 MED ORDER — PRAVASTATIN SODIUM 20 MG PO TABS: 20.0000 mg | ORAL_TABLET | Freq: Every day | ORAL | 1 refills | Status: DC

## 2022-01-15 NOTE — Telephone Encounter (Signed)
RX SENT  Metoprolol LRF 10/20/21 #90 0 Pravastatin LRF 10/23/21 #90 0RF LOV 11/14/21 NOV 03/20/22

## 2022-01-15 NOTE — Telephone Encounter (Signed)
[facility] faxed refill request for the following medications:  metoprolol succinate (TOPROL-XL) 25 MG 24 hr tablet  pravastatin (PRAVACHOL) 20 MG tablet  Please advise.

## 2022-01-16 ENCOUNTER — Ambulatory Visit: Payer: Self-pay

## 2022-01-16 NOTE — Telephone Encounter (Signed)
This encounter was created in error - please disregard.

## 2022-01-16 NOTE — Progress Notes (Signed)
Noted, may have been more appropriate to recommend ED, but agree w/ at minimum urgent care

## 2022-01-16 NOTE — Telephone Encounter (Signed)
  Chief Complaint: leg injury Symptoms: RLE and R hip pain 5/10, bruising r/t fall  Frequency: Mon or Tues Pertinent Negatives: NA Disposition: '[]'$ ED /'[x]'$ Urgent Care (no appt availability in office) / '[]'$ Appointment(In office/virtual)/ '[]'$  [place] [facility]/ '[]'$ Home Care/ '[]'$ Refused Recommended Disposition /'[]'$ [place] [facility]/ '[]'$  Follow-up with PCP Additional Notes: pt fell on carpet and hurt R hip and RLE. Same leg that she has broken before. Daughter put a pain patch on RLE to help with pain. Pt is having trouble walking on RLE. No appts available and verified with Arbie Cookey, [facility]. Advised pt could go to UC. Scheduled appt at 1315.   Reason for Disposition  MILD weakness (i.e., does not interfere with ability to work, go to school, normal activities)  (Exception: Mild weakness is a chronic symptom.)  Answer Assessment - Initial Assessment Questions 1. MECHANISM: "How did the fall happen?"     Walking too fast and tripped over carpet  3. ONSET: "When did the fall happen?" (e.g., minutes, hours, or days ago)     Couple of days ago  4. LOCATION: "What part of the body hit the ground?" (e.g., back, buttocks, head, hips, knees, hands, head, stomach)     R leg and hip  6. PAIN: "Is there any pain?" If Yes, ask: "How bad is the pain?" (e.g., Scale 1-10; or mild,  moderate, severe)   - NONE (0): No pain   - MILD (1-3): Doesn't interfere with normal activities    - MODERATE (4-7): Interferes with normal activities or awakens from sleep    - SEVERE (8-10): Excruciating pain, unable to do any normal activities      5 7. SIZE: For cuts, bruises, or swelling, ask: "How large is it?" (e.g., inches or centimeters)      Bruising to RLE  9. OTHER SYMPTOMS: "Do you have any other symptoms?" (e.g., dizziness, fever, weakness; new onset or worsening).  Protocols used: Falls and [facility]

## 2022-01-16 NOTE — Telephone Encounter (Signed)
Chief Complaint: leg injury Symptoms: RLE and R hip pain 5/10, bruising r/t fall  Frequency: Mon or Tues Pertinent Negatives: NA Disposition: '[]'$ ED /'[x]'$ Urgent Care (no appt availability in office) / '[]'$ Appointment(In office/virtual)/ '[]'$  [place] [facility]/ '[]'$ Home Care/ '[]'$ Refused Recommended Disposition /'[]'$ [place] [facility]/ '[]'$  Follow-up with PCP Additional Notes: pt fell on carpet and hurt R hip and RLE. Same leg that she has broken before. Daughter put a pain patch on RLE to help with pain. Pt is having trouble walking on RLE. No appts available and verified with Arbie Cookey, [facility]. Advised pt could go to UC. Scheduled appt at 1315.    Protocols used: Falls and [facility] Reason for Disposition  MILD weakness (i.e., does not interfere with ability to work, go to school, normal activities)  (Exception: Mild weakness is a chronic symptom.)  Answer Assessment - Initial Assessment Questions Answer Assessment - Initial Assessment Questions 1. MECHANISM: "How did the fall happen?"     Walking too fast and tripped over carpet  3. ONSET: "When did the fall happen?" (e.g., minutes, hours, or days ago)     Couple of days ago  4. LOCATION: "What part of the body hit the ground?" (e.g., back, buttocks, head, hips, knees, hands, head, stomach)     R leg and hip  6. PAIN: "Is there any pain?" If Yes, ask: "How bad is the pain?" (e.g., Scale 1-10; or mild,  moderate, severe)   - NONE (0): No pain   - MILD (1-3): Doesn't interfere with normal activities    - MODERATE (4-7): Interferes with normal activities or awakens from sleep    - SEVERE (8-10): Excruciating pain, unable to do any normal activities      5 7. SIZE: For cuts, bruises, or swelling, ask: "How large is it?" (e.g., inches or centimeters)      Bruising to RLE  9. OTHER SYMPTOMS: "Do you have any other symptoms?" (e.g., dizziness, fever, weakness; new onset or worsening).  Protocols used: Falls and [facility]

## 2022-03-13 ENCOUNTER — Encounter: Payer: Self-pay | Admitting: Physician Assistant

## 2022-03-13 ENCOUNTER — Ambulatory Visit (INDEPENDENT_AMBULATORY_CARE_PROVIDER_SITE_OTHER): Payer: PPO | Admitting: Physician Assistant

## 2022-03-13 VITALS — BP 106/90 | HR 63 | Wt 124.8 lb

## 2022-03-13 DIAGNOSIS — F0280 Dementia in other diseases classified elsewhere without behavioral disturbance: Secondary | ICD-10-CM

## 2022-03-13 DIAGNOSIS — F0150 Vascular dementia without behavioral disturbance: Secondary | ICD-10-CM

## 2022-03-13 DIAGNOSIS — Z23 Encounter for immunization: Secondary | ICD-10-CM

## 2022-03-13 DIAGNOSIS — G309 Alzheimer's disease, unspecified: Secondary | ICD-10-CM

## 2022-03-13 DIAGNOSIS — Z9181 History of falling: Secondary | ICD-10-CM | POA: Diagnosis not present

## 2022-03-13 DIAGNOSIS — R531 Weakness: Secondary | ICD-10-CM | POA: Diagnosis not present

## 2022-03-13 DIAGNOSIS — I10 Essential (primary) hypertension: Secondary | ICD-10-CM | POA: Diagnosis not present

## 2022-03-13 NOTE — Progress Notes (Unsigned)
I,Sha'taria Tyson,acting as a Education administrator for [employer], PA-C.,have documented all relevant documentation on the behalf of [name], PA-C,as directed by  [name], PA-C while in the presence of [name], PA-C.   Established patient visit   Patient: Stacey [name]   DOB: [birth year]   76 y.o. Female  MRN: [id number] Visit Date: 03/13/2022  Today's healthcare provider: [name], PA-C   No chief complaint on file.  Subjective    HPI  Influenza: would like to receive today Shingrix: aware to visit pharmacy Covid: decline Hypertension, follow-up  BP Readings from Last 3 Encounters:  11/14/21 96/72  09/03/21 (!) 141/100  08/07/21 110/70   Wt Readings from Last 3 Encounters:  11/14/21 129 lb 9.6 oz (58.8 kg)  09/03/21 126 lb (57.2 kg)  08/07/21 127 lb 11.2 oz (57.9 kg)     She was last seen for hypertension 4 months ago.  BP at that visit was 96/72. Management since that visit includes metoprolol 25 mg daily and lisinopril 5 mg daily .  Use of agents associated with hypertension: none.   Outside blood pressures are {not being checked}.  Pertinent labs Lab Results  Component Value Date   CHOL 174 07/15/2021   HDL 53 07/15/2021   LDLCALC 93 07/15/2021   TRIG 164 (H) 07/15/2021   CHOLHDL 3.3 07/15/2021   Lab Results  Component Value Date   NA 142 01/02/2022   K 4.4 01/02/2022   CREATININE 1.05 (H) 01/02/2022   EGFR 55 (L) 01/02/2022   GLUCOSE 83 01/02/2022   TSH 2.180 04/25/2020     The 10-year ASCVD risk score (Arnett DK, et al., 2019) is: 18.9%  ---------------------------------------------------------------------------------------------------  Lipid/Cholesterol, Follow-up  Last lipid panel Other pertinent labs  Lab Results  Component Value Date   CHOL 174 07/15/2021   HDL 53 07/15/2021   LDLCALC 93 07/15/2021   TRIG 164 (H) 07/15/2021   CHOLHDL 3.3 07/15/2021   Lab Results  Component Value Date   ALT 14 01/02/2022   AST  21 01/02/2022   PLT 182 07/15/2021   TSH 2.180 04/25/2020     She was last seen for this 8 months ago.  Management since that visit includes continue pravastatin 20 mg.  Symptoms: No chest pain No chest pressure/discomfort  No dyspnea No lower extremity edema  No numbness or tingling of extremity No orthopnea  No palpitations No paroxysmal nocturnal dyspnea  No speech difficulty No syncope   The 10-year ASCVD risk score (Arnett DK, et al., 2019) is: 18.9%  ---------------------------------------------------------------------------------------------------   Medications: Outpatient Medications Prior to Visit  Medication Sig   acetaminophen (TYLENOL) 325 MG tablet Take 2 tablets (650 mg total) by mouth every 6 (six) hours as needed for mild pain (or Fever >/= 101).   Ascorbic Acid 500 MG CHEW Chew by mouth.   b complex vitamins capsule Take 1 capsule by mouth daily.   Calcium Citrate-Vitamin D (CALCIUM + D PO) Take 1 tablet by mouth daily.   donepezil (ARICEPT) 5 MG tablet TAKE ONE TABLET BY MOUTH EVERY NIGHT AT BEDTIME   lamoTRIgine (LAMICTAL) 150 MG tablet Take 150 mg by mouth daily.   lisinopril (ZESTRIL) 5 MG tablet Take 1 tablet (5 mg total) by mouth daily.   memantine (NAMENDA) 5 MG tablet Take 5 mg by mouth 2 (two) times daily.   metoprolol succinate (TOPROL-XL) 25 MG 24 hr tablet Take 1 tablet (25 mg total) by mouth daily.   mirtazapine (REMERON) 15  MG tablet Take 15 mg by mouth at bedtime.   Multiple Vitamin (MULTI-VITAMIN DAILY PO) Take 1 tablet by mouth daily.   pravastatin (PRAVACHOL) 20 MG tablet Take 1 tablet (20 mg total) by mouth daily.   No facility-administered medications prior to visit.    Review of Systems  {Labs  Heme  Chem  Endocrine  Serology  Results Review (optional):23779}   Objective    There were no vitals taken for this visit. {Show previous vital signs (optional):23777}  Physical Exam  ***  No results found for any visits on  03/13/22.  Assessment & Plan     ***  No follow-ups on file.      {provider attestation***:1}   [name], PA-C  [facility] [phone] (phone) [phone] (fax)  [place]

## 2022-03-16 ENCOUNTER — Encounter: Payer: Self-pay | Admitting: Physician Assistant

## 2022-03-16 DIAGNOSIS — R531 Weakness: Secondary | ICD-10-CM | POA: Insufficient documentation

## 2022-03-16 DIAGNOSIS — Z9181 History of falling: Secondary | ICD-10-CM | POA: Insufficient documentation

## 2022-03-16 NOTE — Assessment & Plan Note (Signed)
Deconditioning and lack of balance Contributing to fall risk Discussed home pt

## 2022-03-16 NOTE — Assessment & Plan Note (Signed)
Chronic, well controlled. No episodes of orthostatic dizziness Continue medications  Continue monitoring for hypotension at home F/u 6 mo

## 2022-03-16 NOTE — Assessment & Plan Note (Signed)
Follows with neurology 

## 2022-03-16 NOTE — Assessment & Plan Note (Signed)
Discussed safety at home, discussed using walker, not carrying items while walking

## 2022-03-19 ENCOUNTER — Telehealth: Payer: Self-pay | Admitting: *Deleted

## 2022-03-19 NOTE — Telephone Encounter (Signed)
Copied from [place] [id number]. Topic: General - Inquiry >> Mar 19, 2022 12:43 PM Marcellus Scott wrote: Reason for CRM: Pt daughter is returning [place], [place] call.  Please advise.

## 2022-03-20 ENCOUNTER — Telehealth: Payer: Self-pay | Admitting: Physician Assistant

## 2022-03-20 ENCOUNTER — Ambulatory Visit: Payer: PPO | Admitting: Physician Assistant

## 2022-03-20 NOTE — Telephone Encounter (Signed)
Understood! Ty!

## 2022-03-20 NOTE — Telephone Encounter (Signed)
Stacey [name] is Actor with [facility] is calling to report that will be out for PT. Pt requested to go out January 3 CB- [phone]

## 2022-03-20 NOTE — Telephone Encounter (Signed)
Per Dorian Heckle is an FYI for the provider. Patient decided to wait for the admission to start until January 3,2024.

## 2022-04-02 ENCOUNTER — Telehealth: Payer: Self-pay

## 2022-04-02 NOTE — Telephone Encounter (Signed)
Copied from [place] [id number]. Topic: Referral - Status >> Apr 02, 2022  2:38 PM Chapman Fitch wrote: Reason for CRM: [facility] will not admit pt due to the pt not needed home care services at this time/ please advise

## 2022-05-20 ENCOUNTER — Encounter: Payer: Self-pay | Admitting: Dermatology

## 2022-05-20 ENCOUNTER — Other Ambulatory Visit: Payer: Self-pay | Admitting: Dermatology

## 2022-05-20 ENCOUNTER — Ambulatory Visit: Payer: PPO | Admitting: Dermatology

## 2022-05-20 VITALS — BP 104/65 | HR 66

## 2022-05-20 DIAGNOSIS — C44329 Squamous cell carcinoma of skin of other parts of face: Secondary | ICD-10-CM | POA: Diagnosis not present

## 2022-05-20 DIAGNOSIS — L578 Other skin changes due to chronic exposure to nonionizing radiation: Secondary | ICD-10-CM

## 2022-05-20 DIAGNOSIS — L814 Other melanin hyperpigmentation: Secondary | ICD-10-CM | POA: Diagnosis not present

## 2022-05-20 DIAGNOSIS — D2239 Melanocytic nevi of other parts of face: Secondary | ICD-10-CM | POA: Diagnosis not present

## 2022-05-20 DIAGNOSIS — C44320 Squamous cell carcinoma of skin of unspecified parts of face: Secondary | ICD-10-CM

## 2022-05-20 DIAGNOSIS — D3617 Benign neoplasm of peripheral nerves and autonomic nervous system of trunk, unspecified: Secondary | ICD-10-CM | POA: Diagnosis not present

## 2022-05-20 DIAGNOSIS — Z1283 Encounter for screening for malignant neoplasm of skin: Secondary | ICD-10-CM | POA: Diagnosis not present

## 2022-05-20 DIAGNOSIS — D492 Neoplasm of unspecified behavior of bone, soft tissue, and skin: Secondary | ICD-10-CM

## 2022-05-20 DIAGNOSIS — D3610 Benign neoplasm of peripheral nerves and autonomic nervous system, unspecified: Secondary | ICD-10-CM

## 2022-05-20 DIAGNOSIS — L821 Other seborrheic keratosis: Secondary | ICD-10-CM

## 2022-05-20 DIAGNOSIS — C4492 Squamous cell carcinoma of skin, unspecified: Secondary | ICD-10-CM

## 2022-05-20 DIAGNOSIS — D229 Melanocytic nevi, unspecified: Secondary | ICD-10-CM

## 2022-05-20 HISTORY — DX: Squamous cell carcinoma of skin, unspecified: C44.92

## 2022-05-20 NOTE — Patient Instructions (Addendum)
Wound Care Instructions  Cleanse wound gently with soap and water once a day then pat dry with clean gauze. Apply a thin coat of Petrolatum (petroleum jelly, "Vaseline") over the wound (unless you have an allergy to this). We recommend that you use a new, sterile tube of Vaseline. Do not pick or remove scabs. Do not remove the yellow or white "healing tissue" from the base of the wound.  Cover the wound with fresh, clean, nonstick gauze and secure with paper tape. You may use Band-Aids in place of gauze and tape if the wound is small enough, but would recommend trimming much of the tape off as there is often too much. Sometimes Band-Aids can irritate the skin.  You should call the office for your biopsy report after 1 week if you have not already been contacted.  If you experience any problems, such as abnormal amounts of bleeding, swelling, significant bruising, significant pain, or evidence of infection, please call the office immediately.  FOR ADULT SURGERY PATIENTS: If you need something for pain relief you may take 1 extra strength Tylenol (acetaminophen) AND 2 Ibuprofen (216m each) together every 4 hours as needed for pain. (do not take these if you are allergic to them or if you have a reason you should not take them.) Typically, you may only need pain medication for 1 to 3 days.     Seborrheic Keratosis  What causes seborrheic keratoses? Seborrheic keratoses are harmless, common skin growths that first appear during adult life.  As time goes by, more growths appear.  Some people may develop a large number of them.  Seborrheic keratoses appear on both covered and uncovered body parts.  They are not caused by sunlight.  The tendency to develop seborrheic keratoses can be inherited.  They vary in color from skin-colored to gray, brown, or even black.  They can be either smooth or have a rough, warty surface.   Seborrheic keratoses are superficial and look as if they were stuck on the skin.   Under the microscope this type of keratosis looks like layers upon layers of skin.  That is why at times the top layer may seem to fall off, but the rest of the growth remains and re-grows.    Treatment Seborrheic keratoses do not need to be treated, but can easily be removed in the office.  Seborrheic keratoses often cause symptoms when they rub on clothing or jewelry.  Lesions can be in the way of shaving.  If they become inflamed, they can cause itching, soreness, or burning.  Removal of a seborrheic keratosis can be accomplished by freezing, burning, or surgery. If any spot bleeds, scabs, or grows rapidly, please return to have it checked, as these can be an indication of a skin cancer.    Melanoma ABCDEs  Melanoma is the most dangerous type of skin cancer, and is the leading cause of death from skin disease.  You are more likely to develop melanoma if you: Have light-colored skin, light-colored eyes, or red or blond hair Spend a lot of time in the sun Tan regularly, either outdoors or in a tanning bed Have had blistering sunburns, especially during childhood Have a close family member who has had a melanoma Have atypical moles or large birthmarks  Early detection of melanoma is key since treatment is typically straightforward and cure rates are extremely high if we catch it early.   The first sign of melanoma is often a change in a mole or a  new dark spot.  The ABCDE system is a way of remembering the signs of melanoma.  A for asymmetry:  The two halves do not match. B for border:  The edges of the growth are irregular. C for color:  A mixture of colors are present instead of an even brown color. D for diameter:  Melanomas are usually (but not always) greater than 30m - the size of a pencil eraser. E for evolution:  The spot keeps changing in size, shape, and color.  Please check your skin once per month between visits. You can use a small mirror in front and a large mirror behind  you to keep an eye on the back side or your body.   If you see any new or changing lesions before your next follow-up, please call to schedule a visit.  Please continue daily skin protection including broad spectrum sunscreen SPF 30+ to sun-exposed areas, reapplying every 2 hours as needed when you're outdoors.   Staying in the shade or wearing long sleeves, sun glasses (UVA+UVB protection) and wide brim hats (4-inch brim around the entire circumference of the hat) are also recommended for sun protection.     Gentle Skin Care Guide  1. Bathe no more than once a day.  2. Avoid bathing in hot water  3. Use a mild soap like Dove, Vanicream, Cetaphil, CeraVe. Can use Lever 2000 or Cetaphil antibacterial soap  4. Use soap only where you need it. On most days, use it under your arms, between your legs, and on your feet. Let the water rinse other areas unless visibly dirty.  5. When you get out of the bath/shower, use a towel to gently blot your skin dry, don't rub it.  6. While your skin is still a little damp, apply a moisturizing cream such as Vanicream, CeraVe, Cetaphil, Eucerin, Sarna lotion or plain Vaseline Jelly. For hands apply Neutrogena [place]Hand Cream or Excipial Hand Cream.  7. Reapply moisturizer any time you start to itch or feel dry.  8. Sometimes using free and clear laundry detergents can be helpful. Fabric softener sheets should be avoided. Downy Free & Gentle liquid, or any liquid fabric softener that is free of dyes and perfumes, it acceptable to use  9. If your doctor has given you prescription creams you may apply moisturizers over them     Due to recent changes in healthcare laws, you may see results of your pathology and/or laboratory studies on MyChart before the doctors have had a chance to review them. We understand that in some cases there may be results that are confusing or concerning to you. Please understand that not all results are received at the same  time and often the doctors may need to interpret multiple results in order to provide you with the best plan of care or course of treatment. Therefore, we ask that you please give u[place]2 business days to thoroughly review all your results before contacting the office for clarification. Should we see a critical lab result, you will be contacted sooner.   If You Need Anything After Your Visit  If you have any questions or concerns for your doctor, please call our main line at [phone] press option 4 to reach your doctor's medical assistant. If no one answers, please leave a voicemail as directed and we will return your call as soon as possible. Messages left after 4 pm will be answered the following business day.   You may also send  [place] a message via [place]. We typically respond to MyChart messages within 1-2 business days.  For prescription refills, please ask your pharmacy to contact our office. Our fax number is [phone].  If you have an urgent issue when the clinic is closed that cannot wait until the next business day, you can page your doctor at the number below.    Please note that while we do our best to be available for urgent issues outside of office hours, we are not available 24/7.   If you have an urgent issue and are unable to reach [place], you may choose to seek medical care at your doctor's office, retail clinic, urgent care center, or emergency room.  If you have a medical emergency, please immediately call 911 or go to the emergency department.  Pager Numbers  - Dr. Nehemiah Massed: [phone]  - Dr. Laurence Ferrari: [phone]  - Dr. Nicole Kindred: [phone]  In the event of inclement weather, please call our main line at [phone] for an update on the status of any delays or closures.  Dermatology Medication Tips: Please keep the boxes that topical medications come in in order to help keep track of the instructions about where and how to use these. Pharmacies typically print  the medication instructions only on the boxes and not directly on the medication tubes.   If your medication is too expensive, please contact our office at [phone] option 4 or send [place] a message through [place].   We are unable to tell what your co-pay for medications will be in advance as this is different depending on your insurance coverage. However, we may be able to find a substitute medication at lower cost or fill out paperwork to get insurance to cover a needed medication.   If a prior authorization is required to get your medication covered by your insurance company, please allow [place] 1-2 business days to complete this process.  Drug prices often vary depending on where the prescription is filled and some pharmacies may offer cheaper prices.  The website www.goodrx.com contains coupons for medications through different pharmacies. The prices here do not account for what the cost may be with help from insurance (it may be cheaper with your insurance), but the website can give you the price if you did not use any insurance.  - You can print the associated coupon and take it with your prescription to the pharmacy.  - You may also stop by our office during regular business hours and pick up a GoodRx coupon card.  - If you need your prescription sent electronically to a different pharmacy, notify our office through [facility] or by phone at [phone] option 4.     Si Usted Necesita Algo Despus de Su Visita  Tambin puede enviarnos un mensaje a travs de Pharmacist, community. Por lo general respondemos a los mensajes de MyChart en el transcurso de 1 a 2 das hbiles.  Para renovar recetas, por favor pida a su farmacia que se ponga en contacto con nuestra oficina. Harland Dingwall de fax es [place] [phone].  Si tiene un asunto urgente cuando la clnica est cerrada y que no puede esperar hasta el siguiente da hbil, puede llamar/localizar a su doctor(a) al nmero que aparece a  continuacin.   Por favor, tenga en cuenta que aunque hacemos todo lo posible para estar disponibles para asuntos urgentes fuera del horario de [place], no estamos disponibles las 24 horas del da, los 7 das de la [place].   Si tiene un  problema urgente y no puede comunicarse con nosotros, puede optar por buscar atencin mdica  en el consultorio de su doctor(a), en una clnica privada, en un centro de atencin urgente o en una sala de emergencias.  Si tiene Engineering geologist, por favor llame inmediatamente al 911 o vaya a la sala de emergencias.  Nmeros de bper  - Dr. Nehemiah Massed: [phone]  - Dra. Moye: [phone]  - Dra. Nicole Kindred: [phone]  En caso de inclemencias del [place], por favor llame a Johnsie Kindred principal al [phone] para una actualizacin sobre el [place] de cualquier retraso o cierre.  Consejos para la medicacin en dermatologa: Por favor, guarde las cajas en las que vienen los medicamentos de uso tpico para ayudarle a seguir las instrucciones sobre dnde y cmo usarlos. Las farmacias generalmente imprimen las instrucciones del medicamento slo en las cajas y no directamente en los tubos del [place].   Si su medicamento es muy caro, por favor, pngase en contacto con Zigmund Daniel llamando al [phone] y presione la opcin 4 o envenos un mensaje a travs de Pharmacist, community.   No podemos decirle cul ser su copago por los medicamentos por adelantado ya que esto es diferente dependiendo de la cobertura de su seguro. Sin embargo, es posible que podamos encontrar un medicamento sustituto a Electrical engineer un formulario para que el seguro cubra el medicamento que se considera necesario.   Si se requiere una autorizacin previa para que su compaa de seguros [place] su medicamento, por favor permtanos de 1 a 2 das hbiles para completar este proceso.  Los precios de los medicamentos varan con frecuencia dependiendo del Environmental consultant de dnde se surte la receta  y alguna farmacias pueden ofrecer precios ms baratos.  El sitio web www.goodrx.com tiene cupones para medicamentos de Airline pilot. Los precios aqu no tienen en cuenta lo que podra costar con la ayuda del seguro (puede ser ms barato con su seguro), pero el sitio web puede darle el precio si no utiliz Research scientist (physical sciences).  - Puede imprimir el cupn correspondiente y llevarlo con su receta a la farmacia.  - Tambin puede pasar por nuestra oficina durante el horario de atencin regular y Charity fundraiser una tarjeta de cupones de GoodRx.  - Si necesita que su receta se enve electrnicamente a una farmacia diferente, informe a nuestra oficina a travs de MyChart de  o por telfono llamando al [phone] y presione la opcin 4.

## 2022-05-20 NOTE — Progress Notes (Signed)
New Patient Visit  Subjective  Stacey [name] is a 77 y.o. female who presents for the following: Skin Problem (Check spot on face. ) and Annual Exam (Hx of BCC).  The patient has spots, moles and lesions to be evaluated, some may be new or changing and the patient has concerns that these could be cancer.   Patient accompanied by daughter who contributes to history.   Objective  Well appearing patient in no apparent distress; mood and affect are within normal limits.  All skin waist up examined.  Right Zygomatic Area 7 mm pearly papule     Right Alar Crease 4 mm flesh papule     Back Pink fleshy papules   Assessment & Plan  Neoplasm of skin Right Zygomatic Area  Epidermal / dermal shaving  Lesion diameter (cm):  0.7 Informed consent: discussed and consent obtained   Patient was prepped and draped in usual sterile fashion: Area prepped with alcohol. Anesthesia: the lesion was anesthetized in a standard fashion   Anesthetic:  1% lidocaine w/ epinephrine 1-100,000 buffered w/ 8.4% NaHCO3 Instrument used: flexible razor blade   Hemostasis achieved with: pressure, aluminum chloride and electrodesiccation   Outcome: patient tolerated procedure well    Destruction of lesion  Destruction method: electrodesiccation and curettage   Timeout:  patient name, date of birth, surgical site, and procedure verified Anesthesia: the lesion was anesthetized in a standard fashion   Anesthetic:  1% lidocaine w/ epinephrine 1-100,000 buffered w/ 8.4% NaHCO3 Curettage performed in three different directions: Yes   Electrodesiccation performed over the curetted area: Yes   Lesion length (cm):  0.7 Lesion width (cm):  0.7 Margin per side (cm):  0.1 Final wound size (cm):  0.9 Hemostasis achieved with:  pressure, aluminum chloride and electrodesiccation Outcome: patient tolerated procedure well with no complications   Post-procedure details: wound care instructions given    Additional details:  Mupirocin ointment and Bandaid applied   Specimen 1 - Surgical pathology Differential Diagnosis: R/O BCC  Check Margins: No  Discussed EDC vs Excision.   EDC performed today.  Nevus Right Alar Crease  Benign-appearing.  Observation.  Call clinic for new or changing lesions.  Recommend daily use of broad spectrum spf 30+ sunscreen to sun-exposed areas.    Neurofibroma Back  Benign-appearing.  Observation.  Call clinic for new or changing lesions.  Recommend daily use of broad spectrum spf 30+ sunscreen to sun-exposed areas.     Lentigines - Scattered tan macules - Due to sun exposure - Benign-appearing, observe - Recommend daily broad spectrum sunscreen SPF 30+ to sun-exposed areas, reapply every 2 hours as needed. - Call for any changes  Seborrheic Keratoses. Back. - Stuck-on, waxy, tan-brown papules and/or plaques  - Benign-appearing - Discussed benign etiology and prognosis. - Observe - Call for any changes  Melanocytic Nevi - Tan-brown and/or pink-flesh-colored symmetric macules and papules - Benign appearing on exam today - Observation - Call clinic for new or changing moles - Recommend daily use of broad spectrum spf 30+ sunscreen to sun-exposed areas.   Hemangiomas - Red papules - Discussed benign nature - Observe - Call for any changes  Actinic Damage - Chronic condition, secondary to cumulative UV/sun exposure - diffuse scaly erythematous macules with underlying dyspigmentation - Recommend daily broad spectrum sunscreen SPF 30+ to sun-exposed areas, reapply every 2 hours as needed.  - Staying in the shade or wearing long sleeves, sun glasses (UVA+UVB protection) and wide brim hats (4-inch brim around the entire  circumference of the hat) are also recommended for sun protection.  - Call for new or changing lesions.  Skin cancer screening performed today.     Return in about 3 months (around 08/18/2022) for Biopsy Follow  Up.   I, Emelia Salisbury, CMA, am acting as scribe for Brendolyn Patty, MD.  Documentation: I have reviewed the above documentation for accuracy and completeness, and I agree with the above.  Brendolyn Patty MD

## 2022-05-25 ENCOUNTER — Telehealth: Payer: Self-pay

## 2022-05-25 NOTE — Telephone Encounter (Signed)
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  If it recurs, will need excision/mohs surgery    - please call patient

## 2022-05-25 NOTE — Telephone Encounter (Signed)
Discussed biopsy results with patient daughter- [name]

## 2022-05-25 NOTE — Telephone Encounter (Signed)
Lft pt msg to call for bx results/sh °

## 2022-06-02 ENCOUNTER — Telehealth: Payer: Self-pay | Admitting: Physician Assistant

## 2022-06-02 NOTE — Telephone Encounter (Signed)
Called patient to schedule Medicare Annual Wellness Visit (AWV). Left message for patient to call back and schedule Medicare Annual Wellness Visit (AWV).  Re-scheduled AWV on : 08/13/2022  Please schedule an appointment at any time with nha .  If any questions, please contact me at [phone].  Thank you ,  [place] Direct Dial: [phone]

## 2022-06-16 ENCOUNTER — Telehealth: Payer: Self-pay | Admitting: Physician Assistant

## 2022-06-16 NOTE — Telephone Encounter (Signed)
Called patient to RE-schedule Medicare Annual Wellness Visit (AWV). Left message for patient to call back and schedule Medicare Annual Wellness Visit (AWV). LVM informing patient that her AWV on 08/13/22 had to be changed due to [facility] in-office.   Re-scheduled AWV: 08/17/2022  Please schedule an appointment at any time with NHA.  If any questions, please contact me at [phone].  Thank you ,  [place] Direct Dial: [phone]

## 2022-06-27 IMAGING — CR DG KNEE COMPLETE 4+V*R*
1 series · 4 of 4 positions shown · non-contrast
Comparison: OR C-arm, 02/05/2015.

CLINICAL DATA: knee pain, hx of fall a mo ago

EXAM:
RIGHT KNEE - COMPLETE 4+ VIEW

[Series 1: dg knee complete 4 views right · 0.14mm/px · 4 of 4 slices shown]
[im 1/4]
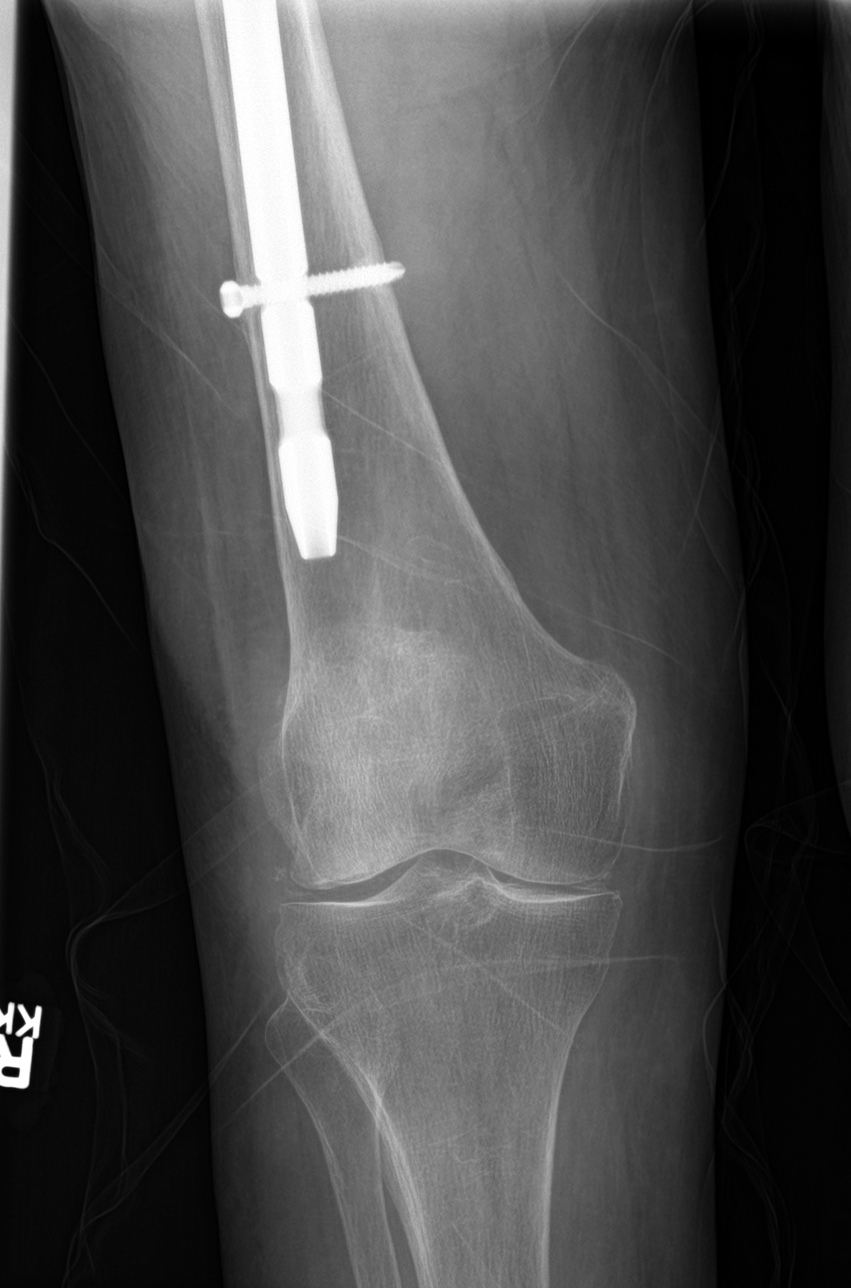
[im 2/4]
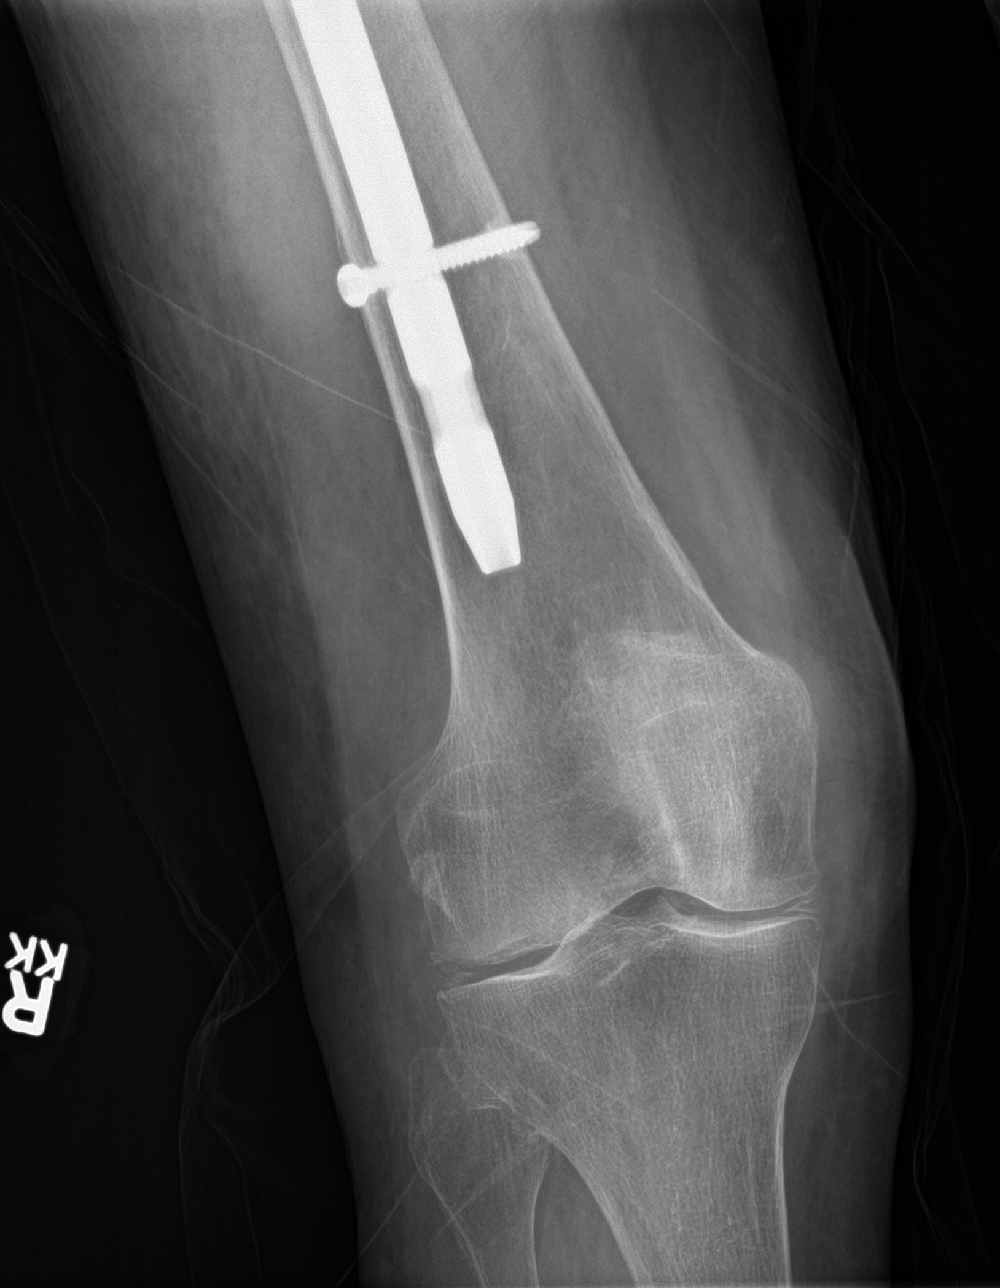
[im 3/4]
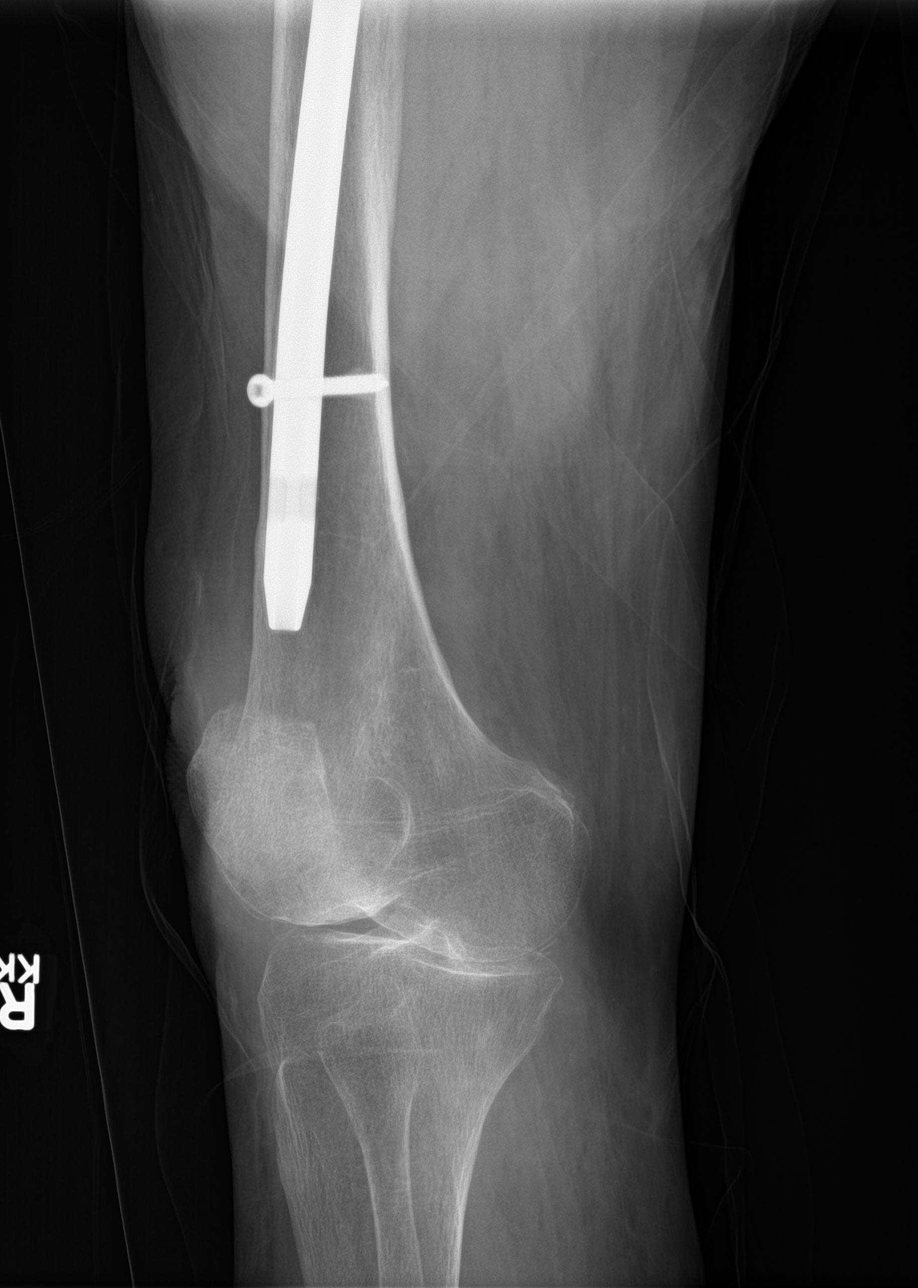
[im 4/4]
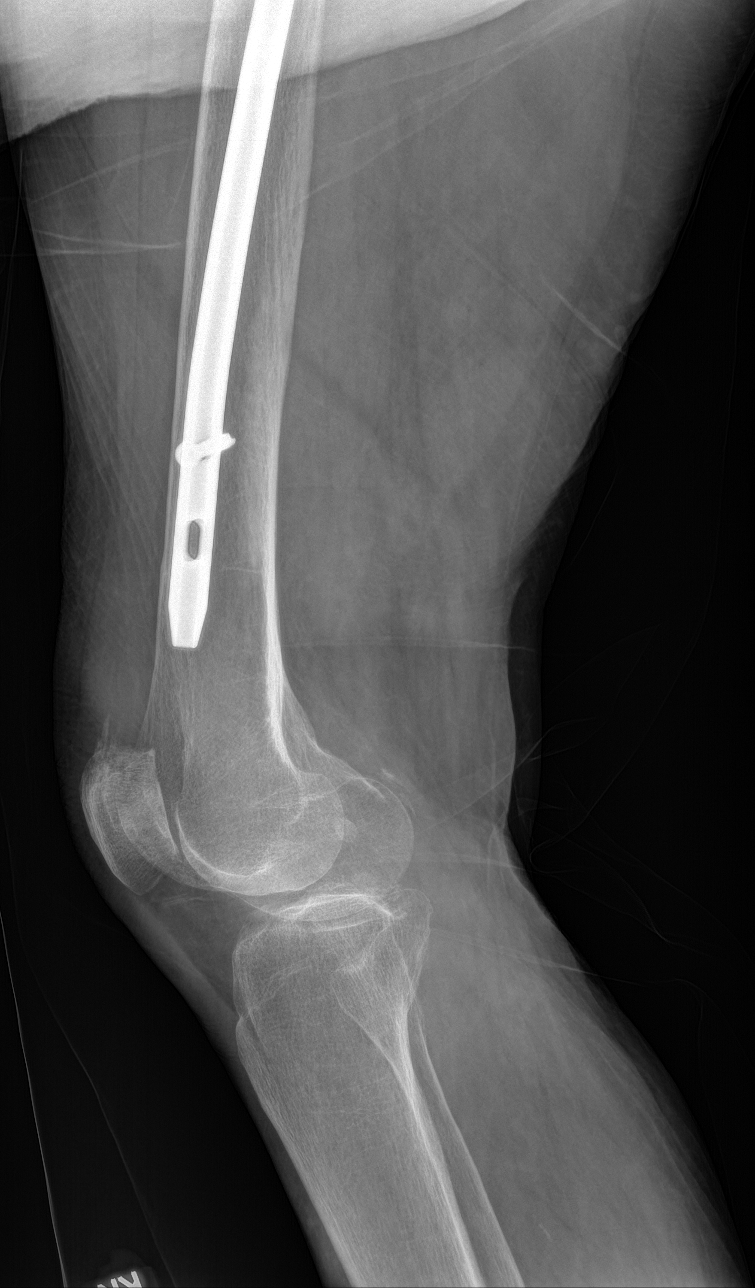

[4 of 4 positions shown; findings below may reference images not displayed]

FINDINGS: RIGHT distal femoral fixation. No periprosthetic lucency or
fracture.

Tricompartment RIGHT knee osteoarthrosis including severe disc space
narrowing of the medial compartment, chondral calcifications and
mild osteophyte formation patellofemoral compartment.

No joint effusion. Soft tissues are unremarkable.
IMPRESSION: 1. RIGHT distal femoral fixation. No acute periprosthetic or
fracture.
2. RIGHT knee osteoarthrosis, greatest within the medial compartment
as described above.

## 2022-07-18 ENCOUNTER — Emergency Department: Payer: PPO

## 2022-07-18 ENCOUNTER — Other Ambulatory Visit: Payer: Self-pay

## 2022-07-18 ENCOUNTER — Emergency Department
Admission: EM | Admit: 2022-07-18 | Discharge: 2022-07-18 | Disposition: A | Discharge status: Home / Self Care | Payer: PPO | Attending: Emergency Medicine | Admitting: Emergency Medicine

## 2022-07-18 DIAGNOSIS — F0390 Unspecified dementia without behavioral disturbance: Secondary | ICD-10-CM | POA: Insufficient documentation

## 2022-07-18 DIAGNOSIS — S0181XA Laceration without foreign body of other part of head, initial encounter: Secondary | ICD-10-CM | POA: Insufficient documentation

## 2022-07-18 DIAGNOSIS — S62347A Nondisplaced fracture of base of fifth metacarpal bone. left hand, initial encounter for closed fracture: Secondary | ICD-10-CM | POA: Diagnosis not present

## 2022-07-18 DIAGNOSIS — M79642 Pain in left hand: Secondary | ICD-10-CM | POA: Diagnosis present

## 2022-07-18 DIAGNOSIS — W01198A Fall on same level from slipping, tripping and stumbling with subsequent striking against other object, initial encounter: Secondary | ICD-10-CM | POA: Insufficient documentation

## 2022-07-18 DIAGNOSIS — S0990XA Unspecified injury of head, initial encounter: Secondary | ICD-10-CM

## 2022-07-18 NOTE — ED Triage Notes (Signed)
Pt to ED via POV from home. Pt fell over a stump, hit her head and fell on left arm. Pt reports left wrist and left shoulder pain. Pt with hx dementia. Pt denies blood thinners.

## 2022-07-18 NOTE — ED Provider Notes (Signed)
[place] EMERGENCY DEPARTMENT AT [facility] Provider Note   CSN: [id number] Arrival date & time: 07/18/22  1636     History  Chief Complaint  Patient presents with   Marletta Lor    Stacey [name] is a 77 y.o. female.  With history of dementia presents to the emergency department for evaluation of a fall that occurred earlier today.  Patient hit her head, denies headache LOC nausea vomiting.  No neck or back pain.  She has been ambulatory with no dizziness or lightheadedness.  She has a small superficial laceration along the left forehead along with some pain throughout her left hand.  She denies any numbness or tingling in the upper or lower extremities.  No groin or thigh pain.  HPI     Home Medications Prior to Admission medications   Medication Sig Start Date End Date Taking? Authorizing Provider  acetaminophen (TYLENOL) 325 MG tablet Take 2 tablets (650 mg total) by mouth every 6 (six) hours as needed for mild pain (or Fever >/= 101). 02/08/15   Ramonita Lab, MD  Calcium Citrate-Vitamin D (CALCIUM + D PO) Take 1 tablet by mouth daily.    [provider]  donepezil (ARICEPT) 5 MG tablet TAKE ONE TABLET BY MOUTH EVERY NIGHT AT BEDTIME 10/23/20   Chrismon, Jodell Cipro, PA-C  lamoTRIgine (LAMICTAL) 150 MG tablet Take 150 mg by mouth daily. 10/05/21   [provider]  lisinopril (ZESTRIL) 5 MG tablet Take 1 tablet (5 mg total) by mouth daily. 09/03/21   Ostwalt, Edmon Crape, PA-C  memantine (NAMENDA) 5 MG tablet Take 5 mg by mouth 2 (two) times daily.    [provider]  metoprolol succinate (TOPROL-XL) 25 MG 24 hr tablet Take 1 tablet (25 mg total) by mouth daily. 01/15/22   Alfredia Ferguson, PA-C  mirtazapine (REMERON) 15 MG tablet Take 15 mg by mouth at bedtime. 09/25/21   [provider]  Multiple Vitamin (MULTI-VITAMIN DAILY PO) Take 1 tablet by mouth daily.    [provider]  pravastatin (PRAVACHOL) 20 MG tablet Take 1 tablet (20 mg total) by  mouth daily. 01/15/22   Alfredia Ferguson, PA-C      Allergies    Patient has no known allergies.    Review of Systems   Review of Systems  Physical Exam Updated Vital Signs BP (!) 137/106   Pulse 74   Temp 98.1 F (36.7 C) (Oral)   Resp 18   SpO2 98%  Physical Exam Constitutional:      Appearance: She is well-developed.  HENT:     Head: Normocephalic.     Comments: 1 cm laceration left forehead Eyes:     Conjunctiva/sclera: Conjunctivae normal.  Cardiovascular:     Rate and Rhythm: Normal rate.  Pulmonary:     Effort: Pulmonary effort is normal. No respiratory distress.  Abdominal:     General: There is no distension.     Tenderness: There is no abdominal tenderness. There is no guarding.  Musculoskeletal:        General: Normal range of motion.     Cervical back: Normal range of motion.     Comments: Patient ambulatory no antalgic gait in the room.  Both hips move well with internal ex rotation with no discomfort.  She is nontender throughout the cervical thoracic or lumbar spine and has no pain with neck range of motion or palpation along the C-spine.  Normal range of motion of upper extremities.  Her left hand is  tender to palpation along base of fifth metacarpal swelling along the base of fifth metacarpal.  No rotational deformity noted.  2+ radial pulse and 2+ cap refill.  Skin:    General: Skin is warm.     Findings: No rash.  Neurological:     Mental Status: She is alert and oriented to person, place, and time.  Psychiatric:        Behavior: Behavior normal.        Thought Content: Thought content normal.     ED Results / Procedures / Treatments   Labs (all labs ordered are listed, but only abnormal results are displayed) Labs Reviewed - No data to display  EKG None  Radiology DG Wrist Complete Left  Result Date: 07/18/2022 CLINICAL DATA:  Fall with wrist pain. EXAM: LEFT WRIST - COMPLETE 3+ VIEW COMPARISON:  Contralateral wrist radiographs dated  03/11/2009. FINDINGS: There is likely a nondisplaced fracture of the base of the fifth metacarpal. No joint dislocation. Moderate-to-severe chronic degenerative changes involving the ulnar styloid and nearby carpal bones and the metacarpal phalangeal joints likely reflects an inflammatory arthritis such as rheumatoid arthritis. Degenerative changes of the first carpometacarpal joint may reflect osteoarthritis. There is diffuse osseous demineralization. There is soft tissue swelling around the hand and wrist. IMPRESSION: 1. Likely nondisplaced fracture of the base of the fifth metacarpal. 2. Chronic degenerative changes of the hand and wrist. Electronically Signed   By: Romona Curls M.D.   On: 07/18/2022 17:26   CT Head Wo Contrast  Result Date: 07/18/2022 CLINICAL DATA:  Fall EXAM: CT HEAD WITHOUT CONTRAST TECHNIQUE: Contiguous axial images were obtained from the base of the skull through the vertex without intravenous contrast. RADIATION DOSE REDUCTION: This exam was performed according to the departmental dose-optimization program which includes automated exposure control, adjustment of the mA and/or kV according to patient size and/or use of iterative reconstruction technique. COMPARISON:  12/18/2019 FINDINGS: Brain: No evidence of acute infarction, hemorrhage, hydrocephalus, extra-axial collection or mass lesion/mass effect. Scattered low-density changes within the periventricular and subcortical white matter most compatible with chronic microvascular ischemic change. Mild diffuse cerebral volume loss. Vascular: No hyperdense vessel or unexpected calcification. Skull: Normal. Negative for fracture or focal lesion. Sinuses/Orbits: No acute finding. Other: None. IMPRESSION: 1. No acute intracranial abnormality. 2. Chronic microvascular ischemic change and cerebral volume loss. Electronically Signed   By: Duanne Guess D.O.   On: 07/18/2022 17:24    Procedures .Marland KitchenLaceration Repair  Date/Time: 07/18/2022  7:08 PM  Performed by: Evon Slack, PA-C Authorized by: Evon Slack, PA-C   Consent:    Consent obtained:  Verbal   Consent given by:  Patient Laceration details:    Location:  Face   Face location:  Forehead   Length (cm):  1 Treatment:    Area cleansed with:  Saline and povidone-iodine   Amount of cleaning:  Standard   Irrigation method:  Tap Skin repair:    Repair method:  Tissue adhesive Approximation:    Approximation:  Close Repair type:    Repair type:  Simple Post-procedure details:    Dressing:  Open (no dressing) .Ortho Injury Treatment  Date/Time: 07/18/2022 7:09 PM  Performed by: Evon Slack, PA-C Authorized by: Evon Slack, PA-C   Consent:    Consent obtained:  Verbal   Consent given by:  Patient   Risks discussed:  FractureInjury location: hand Location details: left hand Injury type: fracture Fracture type: fifth metacarpal Pre-procedure neurovascular assessment: neurovascularly intact  Pre-procedure range of motion: reduced  Anesthesia: Local anesthesia used: no  Patient sedated: NoManipulation performed: no Immobilization: splint Splint type: ulnar gutter Splint Applied by: ED Provider Supplies used: cotton padding, elastic bandage and Ortho-Glass Post-procedure neurovascular assessment: post-procedure neurovascularly intact Post-procedure distal perfusion: normal Post-procedure neurological function: normal Post-procedure range of motion: unchanged       Medications Ordered in ED Medications - No data to display  ED Course/ Medical Decision Making/ A&P                             Medical Decision Making Amount and/or Complexity of Data Reviewed Radiology: ordered.   77 year old female with small laceration to the left forehead, head injury and left fifth metacarpal fracture.  CT of the head shows no evidence of acute intracranial process.  She has a minimally displaced left fifth metacarpal neck fracture.  She is  placed into a boxer splint today educated on splint care and follow-up with orthopedics.  She will continue with Tylenol as needed for pain.  Small laceration on left side forehead is cleansed and repaired with Dermabond and she is educated on signs symptoms return to the ER for. Final Clinical Impression(s) / ED Diagnoses Final diagnoses:  Injury of head, initial encounter  Nondisplaced fracture of base of fifth metacarpal bone, left hand, initial encounter for closed fracture  Laceration of forehead, initial encounter    Rx / DC Orders ED Discharge Orders     None         Ronnette Juniper 07/18/22 1910    Chesley Noon, MD 07/18/22 1921

## 2022-07-18 NOTE — Discharge Instructions (Signed)
Please take Tylenol as needed for pain.  Wear splint at all times.  Keep splint clean and dry.  Call orthopedic office Monday to schedule follow-up appointment.   Okay to shower and allow Dermabond to come off on its own.

## 2022-07-19 ENCOUNTER — Other Ambulatory Visit: Payer: Self-pay | Admitting: Physician Assistant

## 2022-07-19 DIAGNOSIS — E782 Mixed hyperlipidemia: Secondary | ICD-10-CM

## 2022-07-19 DIAGNOSIS — I10 Essential (primary) hypertension: Secondary | ICD-10-CM

## 2022-07-21 ENCOUNTER — Telehealth: Payer: Self-pay | Admitting: *Deleted

## 2022-07-21 NOTE — Telephone Encounter (Signed)
        Patient  visited [facility] on 07/18/2022  for treatment   Telephone encounter attempt :  1st  A HIPAA compliant voice message was left requesting a return call.  Instructed patient to call back at [phone].  Yehuda Mao Greenauer -[facility] [place], Population Health [phone] [address] [place] , [place] [address] Email : [email or web address]

## 2022-07-22 ENCOUNTER — Telehealth: Payer: Self-pay | Admitting: *Deleted

## 2022-07-22 NOTE — Telephone Encounter (Signed)
    Patient  visited [facility] on 07/18/2022  for treatment    [facility] has transportation to get there as daughter needs to be there         Alois Cliche -[facility] [place], Population Health [phone] [address] [place] , [place] [address] Email : [email or web address]

## 2022-08-04 ENCOUNTER — Ambulatory Visit (INDEPENDENT_AMBULATORY_CARE_PROVIDER_SITE_OTHER): Payer: PPO | Admitting: Dermatology

## 2022-08-04 DIAGNOSIS — L578 Other skin changes due to chronic exposure to nonionizing radiation: Secondary | ICD-10-CM

## 2022-08-04 DIAGNOSIS — D2239 Melanocytic nevi of other parts of face: Secondary | ICD-10-CM

## 2022-08-04 DIAGNOSIS — L570 Actinic keratosis: Secondary | ICD-10-CM

## 2022-08-04 DIAGNOSIS — Z85828 Personal history of other malignant neoplasm of skin: Secondary | ICD-10-CM | POA: Diagnosis not present

## 2022-08-04 DIAGNOSIS — D229 Melanocytic nevi, unspecified: Secondary | ICD-10-CM

## 2022-08-04 NOTE — Progress Notes (Signed)
Follow-Up Visit   Subjective  Stacey [name] is a 77 y.o. female who presents for the following: 3 month recheck hx of scc at right zygomatic area, s/p [facility]   The following portions of the chart were reviewed this encounter and updated as appropriate: medications, allergies, medical history  Review of Systems:  No other skin or systemic complaints except as noted in HPI or Assessment and Plan.  Objective  Well appearing patient in no apparent distress; mood and affect are within normal limits.   A focused examination was performed of the following areas: Face, arms  Relevant exam findings are noted in the Assessment and Plan.  right nasal tip x 1, left nasal dorsum x 1, left zygoma x 1, left upper eyebrow x 1, central upper lip x 1 (5) Erythematous thin papules/macules with gritty scale.     Assessment & Plan   MELANOCYTIC NEVI Exam: Tan-brown and/or pink-flesh-colored symmetric macules and papules Nevus- 4 mm flesh white papule on left alar crease  Treatment Plan: Benign appearing on exam today. Recommend observation. Call clinic for new or changing moles. Recommend daily use of broad spectrum spf 30+ sunscreen to sun-exposed areas.   ACTINIC DAMAGE - chronic, secondary to cumulative UV radiation exposure/sun exposure over time - diffuse scaly erythematous macules with underlying dyspigmentation - Recommend daily broad spectrum sunscreen SPF 30+ to sun-exposed areas, reapply every 2 hours as needed.  - Recommend staying in the shade or wearing long sleeves, sun glasses (UVA+UVB protection) and wide brim hats (4-inch brim around the entire circumference of the hat). - Call for new or changing lesions.  HISTORY OF SQUAMOUS CELL CARCINOMA OF THE SKIN Right zygomatic area [facility] 2/24 - No evidence of recurrence today - Recommend regular full body skin exams - Recommend daily broad spectrum sunscreen SPF 30+ to sun-exposed areas, reapply every 2 hours as needed.  - Call if  any new or changing lesions are noted between office visits  Actinic keratosis (5) right nasal tip x 1, left nasal dorsum x 1, left zygoma x 1, left upper eyebrow x 1, central upper lip x 1  Actinic keratoses are precancerous spots that appear secondary to cumulative UV radiation exposure/sun exposure over time. They are chronic with expected duration over 1 year. A portion of actinic keratoses will progress to squamous cell carcinoma of the skin. It is not possible to reliably predict which spots will progress to skin cancer and so treatment is recommended to prevent development of skin cancer.  Recommend daily broad spectrum sunscreen SPF 30+ to sun-exposed areas, reapply every 2 hours as needed.  Recommend staying in the shade or wearing long sleeves, sun glasses (UVA+UVB protection) and wide brim hats (4-inch brim around the entire circumference of the hat). Call for new or changing lesions.  Destruction of lesion - right nasal tip x 1, left nasal dorsum x 1, left zygoma x 1, left upper eyebrow x 1, central upper lip x 1  Destruction method: cryotherapy   Informed consent: discussed and consent obtained   Lesion destroyed using liquid nitrogen: Yes   Region frozen until ice ball extended beyond lesion: Yes   Outcome: patient tolerated procedure well with no complications   Post-procedure details: wound care instructions given   Additional details:  Prior to procedure, discussed risks of blister formation, small wound, skin dyspigmentation, or rare scar following cryotherapy. Recommend Vaseline ointment to treated areas while healing.     Return in about 6 months (around 02/04/2023) for  ak followup.  I, Asher Muir, CMA, am acting as scribe for Willeen Niece, MD.   Documentation: I have reviewed the above documentation for accuracy and completeness, and I agree with the above.  Willeen Niece, MD

## 2022-08-04 NOTE — Patient Instructions (Addendum)
Actinic keratoses are precancerous spots that appear secondary to cumulative UV radiation exposure/sun exposure over time. They are chronic with expected duration over 1 year. A portion of actinic keratoses will progress to squamous cell carcinoma of the skin. It is not possible to reliably predict which spots will progress to skin cancer and so treatment is recommended to prevent development of skin cancer.  Recommend daily broad spectrum sunscreen SPF 30+ to sun-exposed areas, reapply every 2 hours as needed.  Recommend staying in the shade or wearing long sleeves, sun glasses (UVA+UVB protection) and wide brim hats (4-inch brim around the entire circumference of the hat). Call for new or changing lesions.    Cryotherapy Aftercare  Wash gently with soap and water everyday.   Apply Vaseline and Band-Aid daily until healed.        Melanoma ABCDEs  Melanoma is the most dangerous type of skin cancer, and is the leading cause of death from skin disease.  You are more likely to develop melanoma if you: Have light-colored skin, light-colored eyes, or red or blond hair Spend a lot of time in the sun Tan regularly, either outdoors or in a tanning bed Have had blistering sunburns, especially during childhood Have a close family member who has had a melanoma Have atypical moles or large birthmarks  Early detection of melanoma is key since treatment is typically straightforward and cure rates are extremely high if we catch it early.   The first sign of melanoma is often a change in a mole or a new dark spot.  The ABCDE system is a way of remembering the signs of melanoma.  A for asymmetry:  The two halves do not match. B for border:  The edges of the growth are irregular. C for color:  A mixture of colors are present instead of an even brown color. D for diameter:  Melanomas are usually (but not always) greater than 6mm - the size of a pencil eraser. E for evolution:  The spot keeps  changing in size, shape, and color.  Please check your skin once per month between visits. You can use a small mirror in front and a large mirror behind you to keep an eye on the back side or your body.   If you see any new or changing lesions before your next follow-up, please call to schedule a visit.  Please continue daily skin protection including broad spectrum sunscreen SPF 30+ to sun-exposed areas, reapplying every 2 hours as needed when you're outdoors.   Staying in the shade or wearing long sleeves, sun glasses (UVA+UVB protection) and wide brim hats (4-inch brim around the entire circumference of the hat) are also recommended for sun protection.    Due to recent changes in healthcare laws, you may see results of your pathology and/or laboratory studies on MyChart before the doctors have had a chance to review them. We understand that in some cases there may be results that are confusing or concerning to you. Please understand that not all results are received at the same time and often the doctors may need to interpret multiple results in order to provide you with the best plan of care or course of treatment. Therefore, we ask that you please give us 2 business days to thoroughly review all your results before contacting the office for clarification. Should we see a critical lab result, you will be contacted sooner.   If You Need Anything After Your Visit  If you have any questions   or concerns for your doctor, please call our main line at [phone] and press option 4 to reach your doctor's medical assistant. If no one answers, please leave a voicemail as directed and we will return your call as soon as possible. Messages left after 4 pm will be answered the following business day.   You may also send us a message via MyChart. We typically respond to MyChart messages within 1-2 business days.  For prescription refills, please ask your pharmacy to contact our office. Our fax number is  [phone].  If you have an urgent issue when the clinic is closed that cannot wait until the next business day, you can page your doctor at the number below.    Please note that while we do our best to be available for urgent issues outside of office hours, we are not available 24/7.   If you have an urgent issue and are unable to reach us, you may choose to seek medical care at your doctor's office, retail clinic, urgent care center, or emergency room.  If you have a medical emergency, please immediately call 911 or go to the emergency department.  Pager Numbers  - Dr. Kowalski: [phone]  - Dr. Moye: [phone]  - Dr. Stewart: [phone]  In the event of inclement weather, please call our main line at [phone] for an update on the status of any delays or closures.  Dermatology Medication Tips: Please keep the boxes that topical medications come in in order to help keep track of the instructions about where and how to use these. Pharmacies typically print the medication instructions only on the boxes and not directly on the medication tubes.   If your medication is too expensive, please contact our office at [phone] option 4 or send us a message through MyChart.   We are unable to tell what your co-pay for medications will be in advance as this is different depending on your insurance coverage. However, we may be able to find a substitute medication at lower cost or fill out paperwork to get insurance to cover a needed medication.   If a prior authorization is required to get your medication covered by your insurance company, please allow us 1-2 business days to complete this process.  Drug prices often vary depending on where the prescription is filled and some pharmacies may offer cheaper prices.  The website www.goodrx.com contains coupons for medications through different pharmacies. The prices here do not account for what the cost may be with help from  insurance (it may be cheaper with your insurance), but the website can give you the price if you did not use any insurance.  - You can print the associated coupon and take it with your prescription to the pharmacy.  - You may also stop by our office during regular business hours and pick up a GoodRx coupon card.  - If you need your prescription sent electronically to a different pharmacy, notify our office through [facility] MyChart or by phone at [phone] option 4.     Si Usted Necesita Algo Despus de Su Visita  Tambin puede enviarnos un mensaje a travs de MyChart. Por lo general respondemos a los mensajes de MyChart en el transcurso de 1 a 2 das hbiles.  Para renovar recetas, por favor pida a su farmacia que se ponga en contacto con nuestra oficina. Nuestro nmero de fax es el [phone].  Si tiene un asunto urgente cuando la clnica est cerrada y que no puede   esperar hasta el siguiente da hbil, puede llamar/localizar a su doctor(a) al nmero que aparece a continuacin.   Por favor, tenga en cuenta que aunque hacemos todo lo posible para estar disponibles para asuntos urgentes fuera del horario de oficina, no estamos disponibles las 24 horas del da, los 7 das de la semana.   Si tiene un problema urgente y no puede comunicarse con nosotros, puede optar por buscar atencin mdica  en el consultorio de su doctor(a), en una clnica privada, en un centro de atencin urgente o en una sala de emergencias.  Si tiene una emergencia mdica, por favor llame inmediatamente al 911 o vaya a la sala de emergencias.  Nmeros de bper  - Dr. Kowalski: [phone]  - Dra. Moye: [phone]  - Dra. Stewart: [phone]  En caso de inclemencias del tiempo, por favor llame a nuestra lnea principal al [phone] para una actualizacin sobre el estado de cualquier retraso o cierre.  Consejos para la medicacin en dermatologa: Por favor, guarde las cajas en las que vienen los  medicamentos de uso tpico para ayudarle a seguir las instrucciones sobre dnde y cmo usarlos. Las farmacias generalmente imprimen las instrucciones del medicamento slo en las cajas y no directamente en los tubos del medicamento.   Si su medicamento es muy caro, por favor, pngase en contacto con nuestra oficina llamando al [phone] y presione la opcin 4 o envenos un mensaje a travs de MyChart.   No podemos decirle cul ser su copago por los medicamentos por adelantado ya que esto es diferente dependiendo de la cobertura de su seguro. Sin embargo, es posible que podamos encontrar un medicamento sustituto a menor costo o llenar un formulario para que el seguro cubra el medicamento que se considera necesario.   Si se requiere una autorizacin previa para que su compaa de seguros cubra su medicamento, por favor permtanos de 1 a 2 das hbiles para completar este proceso.  Los precios de los medicamentos varan con frecuencia dependiendo del lugar de dnde se surte la receta y alguna farmacias pueden ofrecer precios ms baratos.  El sitio web www.goodrx.com tiene cupones para medicamentos de diferentes farmacias. Los precios aqu no tienen en cuenta lo que podra costar con la ayuda del seguro (puede ser ms barato con su seguro), pero el sitio web puede darle el precio si no utiliz ningn seguro.  - Puede imprimir el cupn correspondiente y llevarlo con su receta a la farmacia.  - Tambin puede pasar por nuestra oficina durante el horario de atencin regular y recoger una tarjeta de cupones de GoodRx.  - Si necesita que su receta se enve electrnicamente a una farmacia diferente, informe a nuestra oficina a travs de MyChart de [place] o por telfono llamando al [phone] y presione la opcin 4.  

## 2022-08-13 ENCOUNTER — Ambulatory Visit (INDEPENDENT_AMBULATORY_CARE_PROVIDER_SITE_OTHER): Payer: PPO | Admitting: Physician Assistant

## 2022-08-13 ENCOUNTER — Encounter: Payer: Self-pay | Admitting: Physician Assistant

## 2022-08-13 VITALS — BP 112/72 | HR 72 | Wt 122.0 lb

## 2022-08-13 DIAGNOSIS — R42 Dizziness and giddiness: Secondary | ICD-10-CM | POA: Diagnosis not present

## 2022-08-13 NOTE — Progress Notes (Signed)
Established patient visit   Patient: Stacey [name]   DOB: November 10, 1945   77 y.o. Female  MRN: [id number] Visit Date: 08/13/2022  Today's healthcare provider: Alfredia Ferguson, PA-C   Chief Complaint  Patient presents with   Dizziness    Possible Dizziness, fell 2x or feet problem-2 weeks   Subjective    HPI Pt has vaguely complained of dizziness, more falls recently. She had a mechanical fall where she broke her wrist.  D/t dementia, pt is unable to vocalize when she is feeling well/unwell. No excessive fatigue.   Outpatient Medications Prior to Visit  Medication Sig   acetaminophen (TYLENOL) 325 MG tablet Take 2 tablets (650 mg total) by mouth every 6 (six) hours as needed for mild pain (or Fever >/= 101).   Calcium Citrate-Vitamin D (CALCIUM + D PO) Take 1 tablet by mouth daily.   donepezil (ARICEPT) 5 MG tablet TAKE ONE TABLET BY MOUTH EVERY NIGHT AT BEDTIME   lamoTRIgine (LAMICTAL) 150 MG tablet Take 150 mg by mouth daily.   lisinopril (ZESTRIL) 5 MG tablet Take 1 tablet (5 mg total) by mouth daily.   memantine (NAMENDA) 5 MG tablet Take 5 mg by mouth 2 (two) times daily.   metoprolol succinate (TOPROL-XL) 25 MG 24 hr tablet TAKE 1 TABLET BY MOUTH DAILY   mirtazapine (REMERON) 15 MG tablet Take 15 mg by mouth at bedtime.   Multiple Vitamin (MULTI-VITAMIN DAILY PO) Take 1 tablet by mouth daily.   pravastatin (PRAVACHOL) 20 MG tablet TAKE 1 TABLET BY MOUTH DAILY   No facility-administered medications prior to visit.    Review of Systems  Constitutional:  Negative for fatigue and fever.  Respiratory:  Negative for cough and shortness of breath.   Cardiovascular:  Negative for chest pain and leg swelling.  Gastrointestinal:  Negative for abdominal pain.  Neurological:  Negative for dizziness and headaches.       Objective    BP 112/72 (BP Location: Left Arm, Patient Position: Sitting, Cuff Size: Normal)   Pulse 72   Wt 122 lb (55.3 kg)   SpO2 96%   BMI 19.69  kg/m   Physical Exam Constitutional:      General: She is awake.     Appearance: She is well-developed.  HENT:     Head: Normocephalic.  Eyes:     Conjunctiva/sclera: Conjunctivae normal.  Cardiovascular:     Rate and Rhythm: Normal rate and regular rhythm.     Pulses:          Dorsalis pedis pulses are 3+ on the right side and 3+ on the left side.       Posterior tibial pulses are 3+ on the right side and 3+ on the left side.     Heart sounds: Normal heart sounds.  Pulmonary:     Effort: Pulmonary effort is normal.     Breath sounds: Normal breath sounds.  Feet:     Right foot:     Protective Sensation: 4 sites tested.  4 sites sensed.     Skin integrity: Skin integrity normal.     Toenail Condition: Right toenails are abnormally thick.     Left foot:     Protective Sensation: 4 sites tested.  4 sites sensed.     Skin integrity: Skin integrity normal.     Toenail Condition: Left toenails are abnormally thick.  Skin:    General: Skin is warm.  Neurological:     Mental Status: She  is alert and oriented to person, place, and time.  Psychiatric:        Attention and Perception: Attention normal.        Mood and Affect: Mood normal.        Speech: Speech normal.        Behavior: Behavior is cooperative.     No results found for any visits on 08/13/22.  Assessment & Plan     1. Dizziness Vitals stable will check labs Advised daughter to watch for fatigue, gait disturbance. Falls have been mechanical. Foot exam normal   - Comprehensive Metabolic Panel (CMET) - CBC w/Diff/Platelet - TSH + free T4 - HgB A1c - Vitamin B12   Return if symptoms worsen or fail to improve.     I, Alfredia Ferguson, PA-C have reviewed all documentation for this visit. The documentation on  08/13/22   for the exam, diagnosis, procedures, and orders are all accurate and complete.  Alfredia Ferguson, PA-C [facility] [address] [place], [place], [address] Office:  [phone] Fax: [phone]   [facility]

## 2022-08-14 LAB — CBC WITH DIFFERENTIAL/PLATELET
Basophils Absolute: 0.1 10*3/uL (ref 0.0–0.2)
Basos: 1 %
EOS (ABSOLUTE): 0.2 10*3/uL (ref 0.0–0.4)
Eos: 1 %
Hematocrit: 40.6 % (ref 34.0–46.6)
Hemoglobin: 13.4 g/dL (ref 11.1–15.9)
Immature Grans (Abs): 0.1 10*3/uL (ref 0.0–0.1)
Immature Granulocytes: 1 %
Lymphocytes Absolute: 6.3 10*3/uL — ABNORMAL HIGH (ref 0.7–3.1)
Lymphs: 50 %
MCH: 31 pg (ref 26.6–33.0)
MCHC: 33 g/dL (ref 31.5–35.7)
MCV: 94 fL (ref 79–97)
Monocytes Absolute: 0.5 10*3/uL (ref 0.1–0.9)
Monocytes: 4 %
Neutrophils Absolute: 5.2 10*3/uL (ref 1.4–7.0)
Neutrophils: 43 %
Platelets: 198 10*3/uL (ref 150–450)
RBC: 4.32 x10E6/uL (ref 3.77–5.28)
RDW: 11.9 % (ref 11.7–15.4)
WBC: 12.3 10*3/uL — ABNORMAL HIGH (ref 3.4–10.8)

## 2022-08-14 LAB — COMPREHENSIVE METABOLIC PANEL
ALT: 15 IU/L (ref 0–32)
AST: 24 IU/L (ref 0–40)
Albumin/Globulin Ratio: 2.3 — ABNORMAL HIGH (ref 1.2–2.2)
Albumin: 4.3 g/dL (ref 3.8–4.8)
Alkaline Phosphatase: 92 IU/L (ref 44–121)
BUN/Creatinine Ratio: 30 — ABNORMAL HIGH (ref 12–28)
BUN: 33 mg/dL — ABNORMAL HIGH (ref 8–27)
Bilirubin Total: 0.3 mg/dL (ref 0.0–1.2)
CO2: 22 mmol/L (ref 20–29)
Calcium: 9.9 mg/dL (ref 8.7–10.3)
Chloride: 106 mmol/L (ref 96–106)
Creatinine, Ser: 1.11 mg/dL — ABNORMAL HIGH (ref 0.57–1.00)
Globulin, Total: 1.9 g/dL (ref 1.5–4.5)
Glucose: 83 mg/dL (ref 70–99)
Potassium: 4.3 mmol/L (ref 3.5–5.2)
Sodium: 142 mmol/L (ref 134–144)
Total Protein: 6.2 g/dL (ref 6.0–8.5)
eGFR: 51 mL/min/{1.73_m2} — ABNORMAL LOW (ref >59)

## 2022-08-14 LAB — HEMOGLOBIN A1C
Est. average glucose Bld gHb Est-mCnc: 111 mg/dL
Hgb A1c MFr Bld: 5.5 % (ref 4.8–5.6)

## 2022-08-14 LAB — TSH+FREE T4
Free T4: 1.21 ng/dL (ref 0.82–1.77)
TSH: 2.65 u[IU]/mL (ref 0.450–4.500)

## 2022-08-14 LAB — VITAMIN B12: Vitamin B-12: 650 pg/mL (ref 232–1245)

## 2022-08-17 ENCOUNTER — Other Ambulatory Visit: Payer: Self-pay | Admitting: Physician Assistant

## 2022-08-17 DIAGNOSIS — R42 Dizziness and giddiness: Secondary | ICD-10-CM

## 2022-08-19 LAB — URINE CULTURE

## 2022-08-20 ENCOUNTER — Ambulatory Visit: Payer: PPO | Admitting: Podiatry

## 2022-08-20 DIAGNOSIS — Q666 Other congenital valgus deformities of feet: Secondary | ICD-10-CM

## 2022-08-20 NOTE — Progress Notes (Signed)
Subjective:  Patient ID: Stacey [name], female    DOB: Sep 09, 1945,  MRN: [id number]  No chief complaint on file.   77 y.o. female presents with the above complaint.  Patient presents with left submetatarsal 2 porokeratosis painful to touch is progressive gotten worse worse with ambulation worse with pressure she would like to discuss treatment options for that.  She also has a plantar medial.  She would like to discuss orthotics option.  Denies any other acute complaints   Review of Systems: Negative except as noted in the HPI. Denies N/V/F/Ch.  Past Medical History:  Diagnosis Date   Actinic keratosis 02/28/2010   Dorsum nose.   Basal cell carcinoma 05/04/2016   Left sup nasolabial area near nose. Nodular pattern. Re-shave and EDC 06/16/2016   Hyperlipidemia    Hypertension    Olecranon bursitis    left    Osteoporosis    Squamous cell carcinoma of skin 05/20/2022   Right Zygomatic Area, EDC    Current Outpatient Medications:    acetaminophen (TYLENOL) 325 MG tablet, Take 2 tablets (650 mg total) by mouth every 6 (six) hours as needed for mild pain (or Fever >/= 101)., Disp: , Rfl:    Calcium Citrate-Vitamin D (CALCIUM + D PO), Take 1 tablet by mouth daily., Disp: , Rfl:    donepezil (ARICEPT) 5 MG tablet, TAKE ONE TABLET BY MOUTH EVERY NIGHT AT BEDTIME, Disp: 30 tablet, Rfl: 2   lamoTRIgine (LAMICTAL) 150 MG tablet, Take 150 mg by mouth daily., Disp: , Rfl:    lisinopril (ZESTRIL) 5 MG tablet, TAKE 1 TABLET BY MOUTH DAILY, Disp: 90 tablet, Rfl: 0   memantine (NAMENDA) 5 MG tablet, Take 5 mg by mouth 2 (two) times daily., Disp: , Rfl:    metoprolol succinate (TOPROL-XL) 25 MG 24 hr tablet, TAKE 1 TABLET BY MOUTH DAILY, Disp: 90 tablet, Rfl: 1   mirtazapine (REMERON) 15 MG tablet, Take 15 mg by mouth at bedtime., Disp: , Rfl:    Multiple Vitamin (MULTI-VITAMIN DAILY PO), Take 1 tablet by mouth daily., Disp: , Rfl:    pravastatin (PRAVACHOL) 20 MG tablet, TAKE 1 TABLET BY MOUTH  DAILY, Disp: 90 tablet, Rfl: 1  Social History   Tobacco Use  Smoking Status Never  Smokeless Tobacco Never    No Known Allergies Objective:  There were no vitals filed for this visit. There is no height or weight on file to calculate BMI. Constitutional Well developed. Well nourished.  Vascular Dorsalis pedis pulses palpable bilaterally. Posterior tibial pulses palpable bilaterally. Capillary refill normal to all digits.  No cyanosis or clubbing noted. Pedal hair growth normal.  Neurologic Normal speech. Oriented to person, place, and time. Epicritic sensation to light touch grossly present bilaterally.  Dermatologic Left submetatarsal 2 porokeratotic lesion with central nucleated core noted.  Upon debridement no pinpoint bleeding noted.  Gait examination shows pes planovalgus.  Orthopedic: Normal joint ROM without pain or crepitus bilaterally. No visible deformities. No bony tenderness.   Radiographs: None Assessment:   1. Pes planovalgus    Plan:  Patient was evaluated and treated and all questions answered.  Left submetatarsal 2 porokeratotic lesion -Using chisel blade handle the lesion was debrided down to healthy dry tissue.  No complication noted no pinpoint bleeding noted.  Pes planovalgus -I explained to patient the etiology of pes planovalgus and relationship with Planter fasciitis and various treatment options were discussed.  Given patient foot structure in the setting of Planter fasciitis I believe patient will  benefit from custom-made orthotics to help control the hindfoot motion support the arch of the foot and take the stress away from plantar fascial.  Patient agrees with the plan like to proceed with orthotics -Patient was casted for orthotics with offloading of submetatarsal 2   No follow-ups on file. Left submet 2 porokeratosis  Planovalgus orthotics  Toenails to stand

## 2022-08-23 ENCOUNTER — Other Ambulatory Visit: Payer: Self-pay | Admitting: Physician Assistant

## 2022-08-23 DIAGNOSIS — I10 Essential (primary) hypertension: Secondary | ICD-10-CM

## 2022-08-25 NOTE — Telephone Encounter (Signed)
Requested Prescriptions  Pending Prescriptions Disp Refills   lisinopril (ZESTRIL) 5 MG tablet [Pharmacy Med Name: LISINOPRIL 5 MG TABLET] 90 tablet 0    Sig: TAKE 1 TABLET BY MOUTH DAILY     Cardiovascular:  ACE Inhibitors Failed - 08/23/2022 12:58 PM      Failed - Cr in normal range and within 180 days    Creatinine, Ser  Date Value Ref Range Status  08/13/2022 1.11 (H) 0.57 - 1.00 mg/dL Final         Passed - K in normal range and within 180 days    Potassium  Date Value Ref Range Status  08/13/2022 4.3 3.5 - 5.2 mmol/L Final         Passed - Patient is not pregnant      Passed - Last BP in normal range    BP Readings from Last 1 Encounters:  08/13/22 112/72         Passed - Valid encounter within last 6 months    Recent Outpatient Visits           1 week ago Dizziness   [facility] Alfredia Ferguson, PA-C   5 months ago Essential hypertension   [facility] Alfredia Ferguson, PA-C   9 months ago Primary hypertension   [facility] [place], [place], PA-C   11 months ago Acute pain of right knee   [facility] [place], [place], PA-C   1 year ago Mediastinal mass   [facility] Alfredia Ferguson, PA-C       Future Appointments             In 6 months Willeen Niece, MD [facility]

## 2022-09-10 ENCOUNTER — Telehealth: Payer: Self-pay | Admitting: Physician Assistant

## 2022-09-10 NOTE — Telephone Encounter (Signed)
Adult Day Care form being sent back. Please call pt. Daughter, Romualdo Bolk, when completed  [phone]

## 2022-09-14 DIAGNOSIS — Z0279 Encounter for issue of other medical certificate: Secondary | ICD-10-CM

## 2022-10-26 ENCOUNTER — Telehealth: Payer: Self-pay | Admitting: Podiatry

## 2022-10-26 NOTE — Telephone Encounter (Signed)
Lmom for pt to call to set up appt to pick up orthotics ,  they are in the [place] office     Charges need posted

## 2022-11-12 ENCOUNTER — Ambulatory Visit: Payer: PPO | Admitting: Podiatry

## 2022-11-20 ENCOUNTER — Telehealth: Payer: Self-pay | Admitting: Physician Assistant

## 2022-11-20 NOTE — Telephone Encounter (Signed)
[name] is requesting prescription refill lisinopril (ZESTRIL) 5 MG tablet   Please advise

## 2022-11-23 ENCOUNTER — Telehealth: Payer: Self-pay | Admitting: Physician Assistant

## 2022-11-23 DIAGNOSIS — I10 Essential (primary) hypertension: Secondary | ICD-10-CM

## 2022-11-23 NOTE — Telephone Encounter (Signed)
[facility] is requesting prescription refill lisinopril (ZESTRIL) 5 MG tablet   Please advise

## 2022-11-24 ENCOUNTER — Telehealth: Payer: Self-pay

## 2022-11-24 MED ORDER — LISINOPRIL 5 MG PO TABS: 5.0000 mg | ORAL_TABLET | Freq: Every day | ORAL | 0 refills | Status: DC

## 2022-11-24 NOTE — Telephone Encounter (Signed)
Copied from CRM [id number]. Topic: Appointment Scheduling - Scheduling Inquiry for Clinic >> Nov 24, 2022  4:02 PM Turkey B wrote: Reason for CRM: pt called back for AWV appt. Please cb at [phone]

## 2022-11-25 NOTE — Telephone Encounter (Signed)
Medication sent to pharmacy yesterday 

## 2022-11-26 ENCOUNTER — Ambulatory Visit: Payer: PPO | Admitting: Podiatry

## 2022-11-26 DIAGNOSIS — B351 Tinea unguium: Secondary | ICD-10-CM | POA: Diagnosis not present

## 2022-11-26 DIAGNOSIS — Q666 Other congenital valgus deformities of feet: Secondary | ICD-10-CM

## 2022-11-26 DIAGNOSIS — M79674 Pain in right toe(s): Secondary | ICD-10-CM | POA: Diagnosis not present

## 2022-11-26 DIAGNOSIS — M79675 Pain in left toe(s): Secondary | ICD-10-CM | POA: Diagnosis not present

## 2022-11-26 NOTE — Progress Notes (Signed)
  Subjective:  Patient ID: Stacey [name], female    DOB: December 31, 1945,  MRN: [id number]  Chief Complaint  Patient presents with   Nail Problem    Nail trim   77 y.o. female returns for the above complaint.  Patient presents with thickened elongated dystrophic mycotic toenails x 10 she would like to discuss treatment options for she has not seen anyone else prior to seeing me denies any other acute issues pain scale 7 out of 10 dull aching nature.  Objective:  There were no vitals filed for this visit. Podiatric Exam: Vascular: dorsalis pedis and posterior tibial pulses are palpable bilateral. Capillary return is immediate. Temperature gradient is WNL. Skin turgor WNL  Sensorium: Normal Semmes Weinstein monofilament test. Normal tactile sensation bilaterally. Nail Exam: Pt has thick disfigured discolored nails with subungual debris noted bilateral entire nail hallux through fifth toenails.  Pain on palpation to the nails. Ulcer Exam: There is no evidence of ulcer or pre-ulcerative changes or infection. Orthopedic Exam: Muscle tone and strength are WNL. No limitations in general ROM. No crepitus or effusions noted.  Skin: No Porokeratosis. No infection or ulcers    Assessment & Plan:  No diagnosis found.  Patient was evaluated and treated and all questions answered.  Onychomycosis with pain  -Nails palliatively debrided as below. -Educated on self-care  Procedure: Nail Debridement Rationale: pain  Type of Debridement: manual, sharp debridement. Instrumentation: Nail nipper, rotary burr. Number of Nails: 10  Procedures and Treatment: Consent by patient was obtained for treatment procedures. The patient understood the discussion of treatment and procedures well. All questions were answered thoroughly reviewed. Debridement of mycotic and hypertrophic toenails, 1 through 5 bilateral and clearing of subungual debris. No ulceration, no infection noted.  Return Visit-Office Procedure:  Patient instructed to return to the office for a follow up visit 3 months for continued evaluation and treatment.  Pes planovalgus -I explained to patient the etiology of pes planovalgus and relationship with Planter fasciitis and various treatment options were discussed.  Given patient foot structure in the setting of Planter fasciitis I believe patient will benefit from custom-made orthotics to help control the hindfoot motion support the arch of the foot and take the stress away from plantar fascial.  Patient agrees with the plan like to proceed with orthotics -Patient was fitted for the orthotics are functioning well  Nicholes Rough, DPM    No follow-ups on file.

## 2022-12-09 ENCOUNTER — Telehealth: Payer: Self-pay

## 2022-12-09 NOTE — Telephone Encounter (Signed)
Copied from CRM [id number]. Topic: Appointment Scheduling - Scheduling Inquiry for Clinic >> Dec 09, 2022  3:52 PM Marlow Baars wrote: Reason for CRM: The daughter of the patient called stating she is really sorry she missed the appt on 9/03. She would like Steward Drone to call her back to reschedule. She said she knows she can do the 17th as she is off otherwise she works every M, Tu, and Wed all day. Please assist patient further

## 2022-12-15 ENCOUNTER — Telehealth: Payer: Self-pay

## 2022-12-15 NOTE — Telephone Encounter (Signed)
Copied from CRM [id number]. Topic: Appointment Scheduling - Scheduling Inquiry for Clinic >> Dec 15, 2022  9:35 AM Marlow Baars wrote: Reason for CRM: Lupita Leash the patients daughter returned Brenda's phone call to reschedule the patients AWV. Please assist further

## 2022-12-29 ENCOUNTER — Ambulatory Visit: Payer: PPO

## 2022-12-29 VITALS — Ht 63.0 in | Wt 122.0 lb

## 2022-12-29 DIAGNOSIS — Z0000 Encounter for general adult medical examination without abnormal findings: Secondary | ICD-10-CM | POA: Diagnosis not present

## 2022-12-29 NOTE — Patient Instructions (Addendum)
Stacey [name] , Thank you for taking time to come for your Medicare Wellness Visit. I appreciate your ongoing commitment to your health goals. Please review the following plan we discussed and let me know if I can assist you in the future.   Referrals/Orders/Follow-Ups/Clinician Recommendations: referral for CCM to PCP  This is a list of the screening recommended for you and due dates:  Health Maintenance  Topic Date Due   Zoster (Shingles) Vaccine (1 of 2) Never done   Pneumonia Vaccine (1 of 1 - PCV) Never done   Flu Shot  10/29/2022   COVID-19 Vaccine (4 - 2023-24 season) 11/29/2022   Medicare Annual Wellness Visit  12/29/2023   DTaP/Tdap/Td vaccine (3 - Td or Tdap) 06/06/2030   DEXA scan (bone density measurement)  Completed   Hepatitis C Screening  Completed   HPV Vaccine  Aged Out   Cologuard (Stool DNA test)  Discontinued    Advanced directives: (Copy Requested) Please bring a copy of your health care power of attorney and living will to the office to be added to your chart at your convenience.  Next Medicare Annual Wellness Visit scheduled for next year: Yes 01/03/24 @3pm  telephone

## 2022-12-29 NOTE — Progress Notes (Signed)
Subjective:   Stacey [name] is a 77 y.o. female who presents for Medicare Annual (Subsequent) preventive examination.  Visit Complete: Virtual  I connected with Stacey [name] regarding Stacey [name] on 12/29/22 by a audio enabled telemedicine application and verified that I am speaking with the correct person using two identifiers.  Patient Location: Home  Provider Location: Home Office  I discussed the limitations of evaluation and management by telemedicine. The patient expressed understanding and agreed to proceed.  Because this visit was a virtual/telehealth visit, some criteria may be missing or patient reported. Any vitals not documented were not able to be obtained and vitals that have been documented are patient reported.   Patient Medicare AWV questionnaire was completed by the patient on (not done); I have confirmed that all information answered by patient is correct and no changes since this date. Cardiac Risk Factors include: advanced age (>29men, >26 women);hypertension;dyslipidemia    Objective:    Today's Vitals   12/29/22 1527  Weight: 122 lb (55.3 kg)  Height: 5\' 3"  (1.6 m)   Body mass index is 21.61 kg/m.     12/29/2022    4:05 PM 07/18/2022    4:48 PM 08/07/2021    1:26 PM 11/10/2016    3:19 PM 02/04/2015    7:30 PM  Advanced Directives  Does Patient Have a Medical Advance Directive? Yes No No Yes Yes  Type of Estate agent of [place];Living will   Healthcare Power of [place];Living will Living will  Does patient want to make changes to medical advance directive?     No - Patient declined  Copy of Healthcare Power of Attorney in Chart?    No - copy requested No - copy requested  Would patient like information on creating a medical advance directive?  No - Patient declined No - Patient declined      Current Medications (verified) Outpatient Encounter Medications as of 12/29/2022  Medication Sig   acetaminophen  (TYLENOL) 325 MG tablet Take 2 tablets (650 mg total) by mouth every 6 (six) hours as needed for mild pain (or Fever >/= 101).   Calcium Citrate-Vitamin D (CALCIUM + D PO) Take 1 tablet by mouth daily.   donepezil (ARICEPT) 5 MG tablet TAKE ONE TABLET BY MOUTH EVERY NIGHT AT BEDTIME   lamoTRIgine (LAMICTAL) 150 MG tablet Take 150 mg by mouth daily.   lisinopril (ZESTRIL) 5 MG tablet Take 1 tablet (5 mg total) by mouth daily.   memantine (NAMENDA) 5 MG tablet Take 5 mg by mouth 2 (two) times daily.   metoprolol succinate (TOPROL-XL) 25 MG 24 hr tablet TAKE 1 TABLET BY MOUTH DAILY   mirtazapine (REMERON) 15 MG tablet Take 15 mg by mouth at bedtime.   Multiple Vitamin (MULTI-VITAMIN DAILY PO) Take 1 tablet by mouth daily.   pravastatin (PRAVACHOL) 20 MG tablet TAKE 1 TABLET BY MOUTH DAILY   No facility-administered encounter medications on file as of 12/29/2022.    Allergies (verified) Patient has no known allergies.   History: Past Medical History:  Diagnosis Date   Actinic keratosis 02/28/2010   Dorsum nose.   Basal cell carcinoma 05/04/2016   Left sup nasolabial area near nose. Nodular pattern. Re-shave and EDC 06/16/2016   Hyperlipidemia    Hypertension    Olecranon bursitis    left    Osteoporosis    Squamous cell carcinoma of skin 05/20/2022   Right Zygomatic Area, [facility]   Past Surgical History:  Procedure Laterality  Date   ABDOMINAL HYSTERECTOMY     INTRAMEDULLARY (IM) NAIL INTERTROCHANTERIC Right 02/05/2015   Procedure: INTRAMEDULLARY (IM) NAIL INTERTROCHANTRIC;  Surgeon: Christena Flake, MD;  Location: [facility] ORS;  Service: Orthopedics;  Laterality: Right;   Family History  Problem Relation Age of Onset   Cancer Mother        colon   Cancer Father        lung cancer   Social History   Socioeconomic History   Marital status: Married    Spouse name: Not on file   Number of children: Not on file   Years of education: Not on file   Highest education level: Not on file   Occupational History   Not on file  Tobacco Use   Smoking status: Never   Smokeless tobacco: Never  Substance and Sexual Activity   Alcohol use: No   Drug use: No   Sexual activity: Not Currently  Other Topics Concern   Not on file  Social History Narrative   Not on file   Social Determinants of Health   Financial Resource Strain: Low Risk  (12/29/2022)   Overall Financial Resource Strain (CARDIA)    Difficulty of Paying Living Expenses: Not hard at all  Food Insecurity: No Food Insecurity (12/29/2022)   Hunger Vital Sign    Worried About Running Out of Food in the Last Year: Never true    Ran Out of Food in the Last Year: Never true  Transportation Needs: No Transportation Needs (12/29/2022)   PRAPARE - Administrator, Civil Service (Medical): No    Lack of Transportation (Non-Medical): No  Physical Activity: Insufficiently Active (12/29/2022)   Exercise Vital Sign    Days of Exercise per Week: 2 days    Minutes of Exercise per Session: 30 min  Stress: No Stress Concern Present (12/29/2022)   [employer] of Occupational Health - Occupational Stress Questionnaire    Feeling of Stress : Not at all  Social Connections: Moderately Isolated (12/29/2022)   Social Connection and Isolation Panel [NHANES]    Frequency of Communication with Friends and Family: Three times a week    Frequency of Social Gatherings with Friends and Family: Three times a week    Attends Religious Services: More than 4 times per year    Active Member of Clubs or Organizations: No    Attends Banker Meetings: Never    Marital Status: Widowed    Tobacco Counseling Counseling given: Not Answered  Clinical Intake:  Pre-visit preparation completed: No  Pain : No/denies pain    BMI - recorded: 21.61 Nutritional Status: BMI of 19-24  Normal Nutritional Risks: None Diabetes: No  How often do you need to have someone help you when you read instructions, pamphlets, or  other written materials from your doctor or pharmacy?: 1 - Never  Interpreter Needed?: No  Comments: lives with daughter Information entered by :: Stacey Tullo,LPN   Activities of Daily Living    12/29/2022    4:09 PM 08/13/2022    1:51 PM  In your present state of health, do you have any difficulty performing the following activities:  Hearing? 0 0  Vision? 0 0  Difficulty concentrating or making decisions? 1 1  Walking or climbing stairs? 0 0  Dressing or bathing? 0 0  Doing errands, shopping? 1 0  Preparing Food and eating ? N   Using the Toilet? N   In the past six months, have you accidently leaked  urine? Y   Do you have problems with loss of bowel control? N   Managing your Medications? Y   Managing your Finances? Y   Housekeeping or managing your Housekeeping? Y     Patient Care Team: Debera Lat, PA-C as PCP - General (Physician Assistant) Isla Pence, OD as Consulting Physician (Optometry) Deirdre Evener, MD as Consulting Physician (Dermatology)  Indicate any recent Medical Services you may have received from other than [facility] providers in the past year (date may be approximate).     Assessment:   This is a routine wellness examination for [place].  Hearing/Vision screen Hearing Screening - Comments:: Daughter says her hearing is good Vision Screening - Comments:: Daughter says mom wears glasses [facility]   Goals Addressed             This Visit's Progress    DIET - EAT MORE FRUITS AND VEGETABLES   Not on track    Increase water intake   Not on track    Recommend increasing water intake to 4-6 glasses a day.        Depression Screen    12/29/2022    3:58 PM 08/13/2022    1:50 PM 08/07/2021    1:24 PM 07/15/2021    2:24 PM 09/12/2020    2:21 PM 11/10/2016    3:19 PM 10/29/2016   10:55 AM  PHQ 2/9 Scores  PHQ - 2 Score 0 0 0 0 0 0 0  PHQ- 9 Score  1 0 0 0      Fall Risk    12/29/2022    3:48 PM 08/13/2022    1:50 PM 03/16/2022    8:27 AM  03/13/2022    4:43 PM 08/07/2021    1:28 PM  Fall Risk   Falls in the past year? 0 1 1 1 1   Number falls in past yr: 0 1 1 1  0  Injury with Fall? 0 1 1 0 1  Comment  hand/finger     Risk for fall due to : Impaired balance/gait  History of fall(s);Impaired balance/gait;Mental status change;Impaired mobility;Medication side effect History of fall(s) History of fall(s)  Follow up Education provided;Falls prevention discussed  Falls evaluation completed;Education provided;Falls prevention discussed Falls evaluation completed Falls prevention discussed    MEDICARE RISK AT HOME: Medicare Risk at Home Any stairs in or around the home?: Yes If so, are there any without handrails?: Yes Home free of loose throw rugs in walkways, pet beds, electrical cords, etc?: Yes Adequate lighting in your home to reduce risk of falls?: Yes Life alert?: No Use of a cane, walker or w/c?: No Grab bars in the bathroom?: Yes Shower chair or bench in shower?: Yes Elevated toilet seat or a handicapped toilet?: Yes  TIMED UP AND GO:  Was the test performed?  No    Cognitive Function:    08/21/2019    9:11 AM  MMSE - Mini Mental State Exam  Orientation to time 4  Orientation to Place 5  Registration 3  Attention/ Calculation 5  Recall 0  Language- name 2 objects 2  Language- repeat 1  Language- follow 3 step command 2  Language- read & follow direction 1  Write a sentence 1  Copy design 1  Total score 25       Immunizations Immunization History  Administered Date(s) Administered   Fluad Quad(high Dose 65+) 03/13/2022   PFIZER(Purple Top)SARS-COV-2 Vaccination 05/31/2019, 06/18/2019   Pfizer Covid-19 Vaccine Bivalent Booster 48yrs &  up 09/12/2020   Td 03/11/2009   Tdap 06/05/2020    TDAP status: Up to date  Flu Vaccine status: Up to date  Pneumococcal vaccine status: Due, Education has been provided regarding the importance of this vaccine. Advised may receive this vaccine at local pharmacy  or Health Dept. Aware to provide a copy of the vaccination record if obtained from local pharmacy or Health Dept. Verbalized acceptance and understanding.  Covid-19 vaccine status: Completed vaccines  Qualifies for Shingles Vaccine? Yes   Zostavax completed No   Shingrix Completed?: No.    Education has been provided regarding the importance of this vaccine. Patient has been advised to call insurance company to determine out of pocket expense if they have not yet received this vaccine. Advised may also receive vaccine at local pharmacy or Health Dept. Verbalized acceptance and understanding.  Screening Tests Health Maintenance  Topic Date Due   Zoster Vaccines- Shingrix (1 of 2) Never done   Pneumonia Vaccine 70+ Years old (1 of 1 - PCV) Never done   COVID-19 Vaccine (4 - 2023-24 season) 11/29/2022   INFLUENZA VACCINE  06/28/2023 (Originally 10/29/2022)   Medicare Annual Wellness (AWV)  12/29/2023   DTaP/Tdap/Td (3 - Td or Tdap) 06/06/2030   DEXA SCAN  Completed   Hepatitis C Screening  Completed   HPV VACCINES  Aged Out   Fecal DNA (Cologuard)  Discontinued    Health Maintenance  Health Maintenance Due  Topic Date Due   Zoster Vaccines- Shingrix (1 of 2) Never done   Pneumonia Vaccine 74+ Years old (1 of 1 - PCV) Never done   COVID-19 Vaccine (4 - 2023-24 season) 11/29/2022    Colorectal cancer screening: No longer required.   Mammogram status: No longer required due to age.  Bone Density status: Completed yes. Results reflect: Bone density results: OSTEOPOROSIS. Repeat every 3-5 years.  Lung Cancer Screening: (Low Dose CT Chest recommended if Age 15-80 years, 20 pack-year currently smoking OR have quit w/in 15years.) does not qualify.   Lung Cancer Screening Referral: no  Additional Screening:  Hepatitis C Screening: does not qualify; Completed no  Vision Screening: Recommended annual ophthalmology exams for early detection of glaucoma and other disorders of the eye. Is  the patient up to date with their annual eye exam?  Yes  Who is the provider or what is the name of the office in which the patient attends annual eye exams? Dr Clydene Pugh  If pt is not established with a provider, would they like to be referred to a provider to establish care? No .   Dental Screening: Recommended annual dental exams for proper oral hygiene  Diabetic Foot Exam: n/a  Community Resource Referral / Chronic Care Management: CRR required this visit?  No   CCM required this visit?  PCP informed of CCM need    Plan:     I have personally reviewed and noted the following in the patient's chart:   Medical and social history Use of alcohol, tobacco or illicit drugs  Current medications and supplements including opioid prescriptions. Patient is not currently taking opioid prescriptions. Functional ability and status Nutritional status Physical activity Advanced directives List of other physicians Hospitalizations, surgeries, and ER visits in previous 12 months Vitals Screenings to include cognitive, depression, and falls Referrals and appointments  In addition, I have reviewed and discussed with patient certain preventive protocols, quality metrics, and best practice recommendations. A written personalized care plan for preventive services as well as general preventive health  recommendations were provided to patient.   Stacey Lush, LPN   16/03/958   After Visit Summary: (MyChart) Due to this being a telephonic visit, the after visit summary with patients personalized plan was offered to patient via MyChart   Nurse Notes: Pt spoke with daughter , Stacey [name], who pt lives with and is her caretaker. She relays difficulty and stress trying to navigate care of her mom. She works 4-10hr days and pt goes to [employer] 2 days a week. Pt is getting more in need of re-direction and prompting in ADLs. She expresses her need for help with her mother (a Comptroller and even caregiver  counseling/support group/respite services for her. I did encourage daughter to call her insurance plan and ask what services they offer to assist in pt's care.  **Pt needs referral for assessment and care plan for managing disease. Daughter also seeking any caregiver support services.

## 2023-01-14 ENCOUNTER — Other Ambulatory Visit: Payer: Self-pay | Admitting: Physician Assistant

## 2023-01-14 DIAGNOSIS — E782 Mixed hyperlipidemia: Secondary | ICD-10-CM

## 2023-01-14 DIAGNOSIS — I10 Essential (primary) hypertension: Secondary | ICD-10-CM

## 2023-02-12 ENCOUNTER — Ambulatory Visit (INDEPENDENT_AMBULATORY_CARE_PROVIDER_SITE_OTHER): Payer: PPO | Admitting: Physician Assistant

## 2023-02-12 ENCOUNTER — Encounter: Payer: Self-pay | Admitting: Physician Assistant

## 2023-02-12 VITALS — BP 118/94 | HR 65 | Resp 14 | Ht 63.0 in | Wt 125.7 lb

## 2023-02-12 DIAGNOSIS — F0150 Vascular dementia without behavioral disturbance: Secondary | ICD-10-CM

## 2023-02-12 DIAGNOSIS — G309 Alzheimer's disease, unspecified: Secondary | ICD-10-CM | POA: Diagnosis not present

## 2023-02-12 DIAGNOSIS — Z0000 Encounter for general adult medical examination without abnormal findings: Secondary | ICD-10-CM

## 2023-02-12 DIAGNOSIS — I10 Essential (primary) hypertension: Secondary | ICD-10-CM | POA: Diagnosis not present

## 2023-02-12 DIAGNOSIS — F0280 Dementia in other diseases classified elsewhere without behavioral disturbance: Secondary | ICD-10-CM

## 2023-02-12 DIAGNOSIS — E782 Mixed hyperlipidemia: Secondary | ICD-10-CM

## 2023-02-12 DIAGNOSIS — E039 Hypothyroidism, unspecified: Secondary | ICD-10-CM

## 2023-02-12 NOTE — Progress Notes (Unsigned)
New patient visit  Patient: Stacey [name]   DOB: [birth year]   77 y.o. Female  MRN: [id number] Visit Date: 02/12/2023  Today's healthcare provider: Debera Lat, PA-C   No chief complaint on file.  Subjective    Stacey [name] is a 77 y.o. female who presents today as a new patient to establish care.  HPI  *** Discussed the use of AI scribe software for clinical note transcription with the patient, who gave verbal consent to proceed.  History of Present Illness            Past Medical History:  Diagnosis Date   Actinic keratosis 02/28/2010   Dorsum nose.   Basal cell carcinoma 05/04/2016   Left sup nasolabial area near nose. Nodular pattern. Re-shave and EDC 06/16/2016   Hyperlipidemia    Hypertension    Olecranon bursitis    left    Osteoporosis    Squamous cell carcinoma of skin 05/20/2022   Right Zygomatic Area, [facility]   Past Surgical History:  Procedure Laterality Date   ABDOMINAL HYSTERECTOMY     INTRAMEDULLARY (IM) NAIL INTERTROCHANTERIC Right 02/05/2015   Procedure: INTRAMEDULLARY (IM) NAIL INTERTROCHANTRIC;  Surgeon: Christena Flake, MD;  Location: [facility] ORS;  Service: Orthopedics;  Laterality: Right;   Family Status  Relation Name Status   Mother  Deceased   Father  Deceased  No partnership data on file   Family History  Problem Relation Age of Onset   Cancer Mother        colon   Cancer Father        lung cancer   Social History   Socioeconomic History   Marital status: Married    Spouse name: Not on file   Number of children: Not on file   Years of education: Not on file   Highest education level: Not on file  Occupational History   Not on file  Tobacco Use   Smoking status: Never   Smokeless tobacco: Never  Substance and Sexual Activity   Alcohol use: No   Drug use: No   Sexual activity: Not Currently  Other Topics Concern   Not on file  Social History Narrative   Not on file   Social Determinants of Health   Financial Resource  Strain: Low Risk  (12/29/2022)   Overall Financial Resource Strain (CARDIA)    Difficulty of Paying Living Expenses: Not hard at all  Food Insecurity: No Food Insecurity (12/29/2022)   Hunger Vital Sign    Worried About Running Out of Food in the Last Year: Never true    Ran Out of Food in the Last Year: Never true  Transportation Needs: No Transportation Needs (12/29/2022)   PRAPARE - Administrator, Civil Service (Medical): No    Lack of Transportation (Non-Medical): No  Physical Activity: Insufficiently Active (12/29/2022)   Exercise Vital Sign    Days of Exercise per Week: 2 days    Minutes of Exercise per Session: 30 min  Stress: No Stress Concern Present (12/29/2022)   [employer] of Occupational Health - Occupational Stress Questionnaire    Feeling of Stress : Not at all  Social Connections: Moderately Isolated (12/29/2022)   Social Connection and Isolation Panel [NHANES]    Frequency of Communication with Friends and Family: Three times a week    Frequency of Social Gatherings with Friends and Family: Three times a week    Attends Religious Services: More than 4 times per year  Active Member of Clubs or Organizations: No    Attends Banker Meetings: Never    Marital Status: Widowed   Outpatient Medications Prior to Visit  Medication Sig   acetaminophen (TYLENOL) 325 MG tablet Take 2 tablets (650 mg total) by mouth every 6 (six) hours as needed for mild pain (or Fever >/= 101).   Calcium Citrate-Vitamin D (CALCIUM + D PO) Take 1 tablet by mouth daily.   donepezil (ARICEPT) 5 MG tablet TAKE ONE TABLET BY MOUTH EVERY NIGHT AT BEDTIME   lamoTRIgine (LAMICTAL) 150 MG tablet Take 150 mg by mouth daily.   lisinopril (ZESTRIL) 5 MG tablet Take 1 tablet (5 mg total) by mouth daily.   memantine (NAMENDA) 5 MG tablet Take 5 mg by mouth 2 (two) times daily.   metoprolol succinate (TOPROL-XL) 25 MG 24 hr tablet TAKE 1 TABLET BY MOUTH DAILY   mirtazapine  (REMERON) 15 MG tablet Take 15 mg by mouth at bedtime.   Multiple Vitamin (MULTI-VITAMIN DAILY PO) Take 1 tablet by mouth daily.   pravastatin (PRAVACHOL) 20 MG tablet TAKE 1 TABLET BY MOUTH DAILY   No facility-administered medications prior to visit.   No Known Allergies  Immunization History  Administered Date(s) Administered   Fluad Quad(high Dose 65+) 03/13/2022   PFIZER(Purple Top)SARS-COV-2 Vaccination 05/31/2019, 06/18/2019   Pfizer Covid-19 Vaccine Bivalent Booster 22yrs & up 09/12/2020   Td 03/11/2009   Tdap 06/05/2020    Health Maintenance  Topic Date Due   Zoster Vaccines- Shingrix (1 of 2) Never done   Pneumonia Vaccine 70+ Years old (1 of 1 - PCV) Never done   COVID-19 Vaccine (4 - 2023-24 season) 11/29/2022   INFLUENZA VACCINE  06/28/2023 (Originally 10/29/2022)   Medicare Annual Wellness (AWV)  12/29/2023   DTaP/Tdap/Td (3 - Td or Tdap) 06/06/2030   DEXA SCAN  Completed   Hepatitis C Screening  Completed   HPV VACCINES  Aged Out   Fecal DNA (Cologuard)  Discontinued    Patient Care Team: Debera Lat, PA-C as PCP - General (Physician Assistant) Isla Pence, OD as Consulting Physician (Optometry) Deirdre Evener, MD as Consulting Physician (Dermatology)  Review of Systems Except see HPI   {Insert previous labs (optional):23779} {See past labs  Heme  Chem  Endocrine  Serology  Results Review (optional):1}   Objective    There were no vitals taken for this visit. {Insert last BP/Wt (optional):23777}{See vitals history (optional):1}   Physical Exam  Depression Screen    12/29/2022    3:58 PM 08/13/2022    1:50 PM 08/07/2021    1:24 PM 07/15/2021    2:24 PM  PHQ 2/9 Scores  PHQ - 2 Score 0 0 0 0  PHQ- 9 Score  1 0 0   No results found for any visits on 02/12/23.  Assessment & Plan     *** Assessment and Plan              Encounter to establish care Welcomed to our clinic Reviewed past medical hx, social hx, family hx and  surgical hx Pt advised to send all vaccination records or screening   No follow-ups on file.     [facility]

## 2023-02-18 ENCOUNTER — Ambulatory Visit
Admission: RE | Admit: 2023-02-18 | Discharge: 2023-02-18 | Disposition: A | Discharge status: Home / Self Care | Payer: PPO | Source: Ambulatory Visit | Attending: Physician Assistant | Admitting: Physician Assistant

## 2023-02-18 ENCOUNTER — Ambulatory Visit
Admission: RE | Admit: 2023-02-18 | Discharge: 2023-02-18 | Disposition: A | Discharge status: Home / Self Care | Payer: PPO | Attending: Physician Assistant | Admitting: Physician Assistant

## 2023-02-18 DIAGNOSIS — I10 Essential (primary) hypertension: Secondary | ICD-10-CM

## 2023-02-19 ENCOUNTER — Other Ambulatory Visit: Payer: Self-pay | Admitting: Physician Assistant

## 2023-02-19 DIAGNOSIS — I10 Essential (primary) hypertension: Secondary | ICD-10-CM

## 2023-02-19 LAB — COMPREHENSIVE METABOLIC PANEL
ALT: 17 [IU]/L (ref 0–32)
AST: 22 [IU]/L (ref 0–40)
Albumin: 4.4 g/dL (ref 3.8–4.8)
Alkaline Phosphatase: 109 [IU]/L (ref 44–121)
BUN/Creatinine Ratio: 24 (ref 12–28)
BUN: 24 mg/dL (ref 8–27)
Bilirubin Total: 0.3 mg/dL (ref 0.0–1.2)
CO2: 24 mmol/L (ref 20–29)
Calcium: 10 mg/dL (ref 8.7–10.3)
Chloride: 104 mmol/L (ref 96–106)
Creatinine, Ser: 1.02 mg/dL — ABNORMAL HIGH (ref 0.57–1.00)
Globulin, Total: 2 g/dL (ref 1.5–4.5)
Glucose: 90 mg/dL (ref 70–99)
Potassium: 4.5 mmol/L (ref 3.5–5.2)
Sodium: 141 mmol/L (ref 134–144)
Total Protein: 6.4 g/dL (ref 6.0–8.5)
eGFR: 56 mL/min/{1.73_m2} — ABNORMAL LOW (ref >59)

## 2023-02-19 LAB — CBC WITH DIFFERENTIAL/PLATELET
Basophils Absolute: 0.1 10*3/uL (ref 0.0–0.2)
Basos: 0 %
EOS (ABSOLUTE): 0.1 10*3/uL (ref 0.0–0.4)
Eos: 1 %
Hematocrit: 43 % (ref 34.0–46.6)
Hemoglobin: 13.5 g/dL (ref 11.1–15.9)
Immature Grans (Abs): 0 10*3/uL (ref 0.0–0.1)
Immature Granulocytes: 0 %
Lymphocytes Absolute: 10.1 10*3/uL — ABNORMAL HIGH (ref 0.7–3.1)
Lymphs: 59 %
MCH: 30.5 pg (ref 26.6–33.0)
MCHC: 31.4 g/dL — ABNORMAL LOW (ref 31.5–35.7)
MCV: 97 fL (ref 79–97)
Monocytes Absolute: 0.6 10*3/uL (ref 0.1–0.9)
Monocytes: 3 %
Neutrophils Absolute: 6.4 10*3/uL (ref 1.4–7.0)
Neutrophils: 37 %
Platelets: 234 10*3/uL (ref 150–450)
RBC: 4.43 x10E6/uL (ref 3.77–5.28)
RDW: 11.9 % (ref 11.7–15.4)
WBC: 17.2 10*3/uL — ABNORMAL HIGH (ref 3.4–10.8)

## 2023-02-19 LAB — LIPID PANEL
Chol/HDL Ratio: 3.2 ratio (ref 0.0–4.4)
Cholesterol, Total: 188 mg/dL (ref 100–199)
HDL: 58 mg/dL (ref >39)
LDL Chol Calc (NIH): 95 mg/dL (ref 0–99)
Triglycerides: 209 mg/dL — ABNORMAL HIGH (ref 0–149)
VLDL Cholesterol Cal: 35 mg/dL (ref 5–40)

## 2023-02-19 LAB — TSH: TSH: 2.62 u[IU]/mL (ref 0.450–4.500)

## 2023-02-19 LAB — HEMOGLOBIN A1C
Est. average glucose Bld gHb Est-mCnc: 114 mg/dL
Hgb A1c MFr Bld: 5.6 % (ref 4.8–5.6)

## 2023-02-23 ENCOUNTER — Ambulatory Visit: Payer: PPO | Admitting: Dermatology

## 2023-02-23 DIAGNOSIS — D492 Neoplasm of unspecified behavior of bone, soft tissue, and skin: Secondary | ICD-10-CM

## 2023-02-23 DIAGNOSIS — L578 Other skin changes due to chronic exposure to nonionizing radiation: Secondary | ICD-10-CM

## 2023-02-23 DIAGNOSIS — C44319 Basal cell carcinoma of skin of other parts of face: Secondary | ICD-10-CM | POA: Diagnosis not present

## 2023-02-23 DIAGNOSIS — L814 Other melanin hyperpigmentation: Secondary | ICD-10-CM | POA: Diagnosis not present

## 2023-02-23 DIAGNOSIS — L300 Nummular dermatitis: Secondary | ICD-10-CM | POA: Diagnosis not present

## 2023-02-23 DIAGNOSIS — Z872 Personal history of diseases of the skin and subcutaneous tissue: Secondary | ICD-10-CM

## 2023-02-23 DIAGNOSIS — D1801 Hemangioma of skin and subcutaneous tissue: Secondary | ICD-10-CM

## 2023-02-23 DIAGNOSIS — D489 Neoplasm of uncertain behavior, unspecified: Secondary | ICD-10-CM

## 2023-02-23 DIAGNOSIS — L821 Other seborrheic keratosis: Secondary | ICD-10-CM

## 2023-02-23 DIAGNOSIS — W908XXA Exposure to other nonionizing radiation, initial encounter: Secondary | ICD-10-CM

## 2023-02-23 MED ORDER — MOMETASONE FUROATE 0.1 % EX CREA: TOPICAL_CREAM | CUTANEOUS | 0 refills | Status: DC

## 2023-02-23 NOTE — Patient Instructions (Addendum)
For excoriations at back  Avoid picking   Start mometasone cream - apply to affected areas twice daily until clear.   Topical steroids (such as triamcinolone, fluocinolone, fluocinonide, mometasone, clobetasol, halobetasol, betamethasone, hydrocortisone) can cause thinning and lightening of the skin if they are used for too long in the same area. Your physician has selected the right strength medicine for your problem and area affected on the body. Please use your medication only as directed by your physician to prevent side effects.    Samples of Eucerin Cream, Aveeno Cream, Cetaphil Cream  and Dove soap  Gentle Skin Care Guide  1. Bathe no more than once a day.  2. Avoid bathing in hot water  3. Use a mild soap like Dove, Vanicream, Cetaphil, CeraVe. Can use Lever 2000 or Cetaphil antibacterial soap  4. Use soap only where you need it. On most days, use it under your arms, between your legs, and on your feet. Let the water rinse other areas unless visibly dirty.  5. When you get out of the bath/shower, use a towel to gently blot your skin dry, don't rub it.  6. While your skin is still a little damp, apply a moisturizing cream such as Vanicream, CeraVe, Cetaphil, Eucerin, Sarna lotion or plain Vaseline Jelly. For hands apply Neutrogena [place] Hand Cream or Excipial Hand Cream.  7. Reapply moisturizer any time you start to itch or feel dry.  8. Sometimes using free and clear laundry detergents can be helpful. Fabric softener sheets should be avoided. Downy Free & Gentle liquid, or any liquid fabric softener that is free of dyes and perfumes, it acceptable to use  9. If your doctor has given you prescription creams you may apply moisturizers over them     Biopsy Wound Care Instructions  Leave the original bandage on for 24 hours if possible.  If the bandage becomes soaked or soiled before that time, it is OK to remove it and examine the wound.  A small amount of post-operative  bleeding is normal.  If excessive bleeding occurs, remove the bandage, place gauze over the site and apply continuous pressure (no peeking) over the area for 30 minutes. If this does not work, please call our clinic as soon as possible or page your doctor if it is after hours.   Once a day, cleanse the wound with soap and water. It is fine to shower. If a thick crust develops you may use a Q-tip dipped into dilute hydrogen peroxide (mix 1:1 with water) to dissolve it.  Hydrogen peroxide can slow the healing process, so use it only as needed.    After washing, apply petroleum jelly (Vaseline) or an antibiotic ointment if your doctor prescribed one for you, followed by a bandage.    For best healing, the wound should be covered with a layer of ointment at all times. If you are not able to keep the area covered with a bandage to hold the ointment in place, this may mean re-applying the ointment several times a day.  Continue this wound care until the wound has healed and is no longer open.   Itching and mild discomfort is normal during the healing process. However, if you develop pain or severe itching, please call our office.   If you have any discomfort, you can take Tylenol (acetaminophen) or ibuprofen as directed on the bottle. (Please do not take these if you have an allergy to them or cannot take them for another reason).  Some  redness, tenderness and white or yellow material in the wound is normal healing.  If the area becomes very sore and red, or develops a thick yellow-green material (pus), it may be infected; please notify us.    If you have stitches, return to clinic as directed to have the stitches removed. You will continue wound care for 2-3 days after the stitches are removed.   Wound healing continues for up to one year following surgery. It is not unusual to experience pain in the scar from time to time during the interval.  If the pain becomes severe or the scar thickens, you should  notify the office.    A slight amount of redness in a scar is expected for the first six months.  After six months, the redness will fade and the scar will soften and fade.  The color difference becomes less noticeable with time.  If there are any problems, return for a post-op surgery check at your earliest convenience.  To improve the appearance of the scar, you can use silicone scar gel, cream, or sheets (such as Mederma or Serica) every night for up to one year. These are available over the counter (without a prescription).  Please call our office at [phone] for any questions or concerns.     Due to recent changes in healthcare laws, you may see results of your pathology and/or laboratory studies on MyChart before the doctors have had a chance to review them. We understand that in some cases there may be results that are confusing or concerning to you. Please understand that not all results are received at the same time and often the doctors may need to interpret multiple results in order to provide you with the best plan of care or course of treatment. Therefore, we ask that you please give [place] 2 business days to thoroughly review all your results before contacting the office for clarification. Should we see a critical lab result, you will be contacted sooner.   If You Need Anything After Your Visit  If you have any questions or concerns for your doctor, please call our main line at [phone] and press option 4 to reach your doctor's medical assistant. If no one answers, please leave a voicemail as directed and we will return your call as soon as possible. Messages left after 4 pm will be answered the following business day.   You may also send [place] a message via MyChart. We typically respond to MyChart messages within 1-2 business days.  For prescription refills, please ask your pharmacy to contact our office. Our fax number is [phone].  If you have an urgent issue when the  clinic is closed that cannot wait until the next business day, you can page your doctor at the number below.    Please note that while we do our best to be available for urgent issues outside of office hours, we are not available 24/7.   If you have an urgent issue and are unable to reach [place], you may choose to seek medical care at your doctor's office, retail clinic, urgent care center, or emergency room.  If you have a medical emergency, please immediately call 911 or go to the emergency department.  Pager Numbers  - Dr. Gwen Pounds: [phone]  - Dr. Roseanne Reno: [phone]  - Dr. Katrinka Blazing: [phone]   In the event of inclement weather, please call our main line at [phone] for an update on the status of any delays or closures.  Dermatology  Medication Tips: Please keep the boxes that topical medications come in in order to help keep track of the instructions about where and how to use these. Pharmacies typically print the medication instructions only on the boxes and not directly on the medication tubes.   If your medication is too expensive, please contact our office at [phone] option 4 or send [place] a message through MyChart.   We are unable to tell what your co-pay for medications will be in advance as this is different depending on your insurance coverage. However, we may be able to find a substitute medication at lower cost or fill out paperwork to get insurance to cover a needed medication.   If a prior authorization is required to get your medication covered by your insurance company, please allow [place] 1-2 business days to complete this process.  Drug prices often vary depending on where the prescription is filled and some pharmacies may offer cheaper prices.  The website www.goodrx.com contains coupons for medications through different pharmacies. The prices here do not account for what the cost may be with help from insurance (it may be cheaper with your insurance), but the  website can give you the price if you did not use any insurance.  - You can print the associated coupon and take it with your prescription to the pharmacy.  - You may also stop by our office during regular business hours and pick up a GoodRx coupon card.  - If you need your prescription sent electronically to a different pharmacy, notify our office through [facility] or by phone at [phone] option 4.     Si Usted Necesita Algo Despus de Su Visita  Tambin puede enviarnos un mensaje a travs de Clinical cytogeneticist. Por lo general respondemos a los mensajes de MyChart en el transcurso de 1 a 2 das hbiles.  Para renovar recetas, por favor pida a su farmacia que se ponga en contacto con nuestra oficina. Annie Sable de fax es [place] [phone].  Si tiene un asunto urgente cuando la clnica est cerrada y que no puede esperar hasta el siguiente da hbil, puede llamar/localizar a su doctor(a) al nmero que aparece a continuacin.   Por favor, tenga en cuenta que aunque hacemos todo lo posible para estar disponibles para asuntos urgentes fuera del horario de [place], no estamos disponibles las 24 horas del da, los 7 [address]  [address] de la [place].   Si tiene un problema urgente y no puede comunicarse con nosotros, puede optar por buscar atencin mdica  en el consultorio de su doctor(a), en una clnica privada, en un centro de atencin urgente o en una sala de emergencias.  Si tiene Engineer, drilling, por favor llame inmediatamente al 911 o vaya a la sala de emergencias.  Nmeros de bper  - Dr. Gwen Pounds: [phone]  - Dra. Roseanne Reno: [phone]  - Dr. Katrinka Blazing: [phone]   En caso de inclemencias del tiempo, por favor llame a Lacy Duverney principal al [phone] para una actualizacin sobre el [place] de cualquier retraso o cierre.  Consejos para la medicacin en dermatologa: Por favor, guarde las cajas en las que vienen los medicamentos de uso tpico para ayudarle a seguir las  instrucciones sobre dnde y cmo usarlos. Las farmacias generalmente imprimen las instrucciones del medicamento slo en las cajas y no directamente en los tubos del [place].   Si su medicamento es muy caro, por favor, pngase en contacto con Rolm Gala llamando al [phone] y presione la opcin 4 o envenos un  mensaje a travs de MyChart.   No podemos decirle cul ser su copago por los medicamentos por adelantado ya que esto es diferente dependiendo de la cobertura de su seguro. Sin embargo, es posible que podamos encontrar un medicamento sustituto a Audiological scientist un formulario para que el seguro cubra el medicamento que se considera necesario.   Si se requiere una autorizacin previa para que su compaa de seguros [place] su medicamento, por favor permtanos de 1 a 2 das hbiles para completar [address]  Los precios de los medicamentos varan con frecuencia dependiendo del Environmental consultant de dnde se surte la receta y alguna farmacias pueden ofrecer precios ms baratos.  El sitio web www.goodrx.com tiene cupones para medicamentos de Health and safety inspector. Los precios aqu no tienen en cuenta lo que podra costar con la ayuda del seguro (puede ser ms barato con su seguro), pero el sitio web puede darle el precio si no utiliz Tourist information centre manager.  - Puede imprimir el cupn correspondiente y llevarlo con su receta a la farmacia.  - Tambin puede pasar por nuestra oficina durante el horario de atencin regular y Education officer, museum una tarjeta de cupones de GoodRx.  - Si necesita que su receta se enve electrnicamente a una farmacia diferente, informe a nuestra oficina a travs de MyChart de [place] o por telfono llamando al [phone] y presione la opcin 4.

## 2023-02-23 NOTE — Progress Notes (Signed)
Follow-Up Visit   Subjective  Stacey [name] is a 77 y.o. female who presents for the following: 6 month ak followup. Patient is here with her neighbor Steward Drone.  Reports would like spot at forehead and some spots at back checked.   The patient has spots, moles and lesions to be evaluated, some may be new or changing and the patient may have concern these could be cancer.    The following portions of the chart were reviewed this encounter and updated as appropriate: medications, allergies, medical history  Review of Systems:  No other skin or systemic complaints except as noted in HPI or Assessment and Plan.  Objective  Well appearing patient in no apparent distress; mood and affect are within normal limits.   A focused examination was performed of the following areas: Face, back  Relevant exam findings are noted in the Assessment and Plan.  Left upper medial eyebrow 1 cm pink crusted macule          Assessment & Plan   Nummular Dermatitis  Exam: Pink crusted excoriations on upper back and left posterior shoulder, left anterior shoulder with associated scarring  Treatment Plan: Start mometasone cream - apply topically to affected areas twice daily until clear Avoid picking,scratching. Recommend mild soap and moisturizing cream 1-2 times daily.  Gentle skin care handout provided.    Topical steroids (such as triamcinolone, fluocinolone, fluocinonide, mometasone, clobetasol, halobetasol, betamethasone, hydrocortisone) can cause thinning and lightening of the skin if they are used for too long in the same area. Your physician has selected the right strength medicine for your problem and area affected on the body. Please use your medication only as directed by your physician to prevent side effects.    SEBORRHEIC KERATOSIS - Stuck-on, waxy, tan-brown papules and/or plaques back - Benign-appearing - Discussed benign etiology and prognosis. - Observe - Call for any  changes  LENTIGINES Exam: scattered tan macules back Due to sun exposure Treatment Plan: Benign-appearing, observe. Recommend daily broad spectrum sunscreen SPF 30+ to sun-exposed areas, reapply every 2 hours as needed.  Call for any changes   HEMANGIOMA  Exam: red papule(s) back Discussed benign nature. Recommend observation. Call for changes.   Neoplasm of uncertain behavior Left upper medial eyebrow  Skin / nail biopsy Type of biopsy: tangential   Informed consent: discussed and consent obtained   Patient was prepped and draped in usual sterile fashion: Area prepped with alcohol. Anesthesia: the lesion was anesthetized in a standard fashion   Anesthetic:  1% lidocaine w/ epinephrine 1-100,000 buffered w/ 8.4% NaHCO3 Instrument used: flexible razor blade   Hemostasis achieved with: pressure, aluminum chloride and electrodesiccation   Outcome: patient tolerated procedure well   Post-procedure details: wound care instructions given   Post-procedure details comment:  Ointment and small bandage applied  Specimen 1 - Surgical pathology Differential Diagnosis: r/o BCC  Check Margins: No  Excoriation R/o bcc   L upper eyebrow previously treated with cryotherapy, pt denies picking at it  Related Medications mometasone (ELOCON) 0.1 % cream Apply topically to itchy areas at back twice daily as needed until clear  ACTINIC DAMAGE - chronic, secondary to cumulative UV radiation exposure/sun exposure over time - diffuse scaly erythematous macules with underlying dyspigmentation - Recommend daily broad spectrum sunscreen SPF 30+ to sun-exposed areas, reapply every 2 hours as needed.  - Recommend staying in the shade or wearing long sleeves, sun glasses (UVA+UVB protection) and wide brim hats (4-inch brim around the entire circumference of  the hat). - Call for new or changing lesions.  HISTORY OF PRECANCEROUS ACTINIC KERATOSIS right nasal tip x 1, left nasal dorsum x 1, left  zygoma x 1,central upper lip x 1  - site(s) of PreCancerous Actinic Keratosis clear today. - these may recur and new lesions may form requiring treatment to prevent transformation into skin cancer - observe for new or changing spots and contact [facility] for appointment if occur - photoprotection with sun protective clothing; sunglasses and broad spectrum sunscreen with SPF of at least 30 + and frequent self skin exams recommended - yearly exams by a dermatologist recommended for persons with history of PreCancerous Actinic Keratoses   Return in about 6 months (around 08/23/2023) for ak followup.  I, Asher Muir, CMA, am acting as scribe for Willeen Niece, MD.   Documentation: I have reviewed the above documentation for accuracy and completeness, and I agree with the above.  Willeen Niece, MD

## 2023-03-01 ENCOUNTER — Telehealth: Payer: Self-pay

## 2023-03-01 LAB — SURGICAL PATHOLOGY

## 2023-03-01 NOTE — Telephone Encounter (Signed)
-----   Message from Willeen Niece sent at 03/01/2023  1:01 PM EST ----- 1. Skin, left upper medial eyebrow :       BASAL CELL CARCINOMA, SUPERFICIAL AND NODULAR PATTERNS   BCC skin cancer, please refer to Dr. Caralyn Guile for Mohs surgery, or can do EDC in office if she doesn't want Mohs.  EDC would leave a round depressed whitish scar about the same size as the original lesion.  It is treated here in office in a procedure we call "scrape and burn".  No further pathology would be performed.  Mid to high 80% cure rate.  Mohs would leave a linear scar and pathology would be done at time of procedure to ensure complete removal.  It has a high 90s% cure rate. We would refer patient to a specialist for Mohs surgery.  Mohs surgery involves cutting out right around the spot and then checking under the microscope to be sure the whole skin cancer is out. The cure rate is about 98-99%. It is done at another office outside of [place] ([place], [place], or [place]) by a specialist. Once the Merchant navy officer confirms the skin cancer is out, they will discuss the options to repair or heal the area. Often surgical reconstruction of the area is necessary and is done at the same visit after the skin cancer has been removed.  You must take it easy for about two weeks after surgery (no lifting over 10-15 lbs, avoid activity to get your heart rate and blood pressure up).     - please call patient

## 2023-03-01 NOTE — Telephone Encounter (Signed)
Left pt msg to call for bx results/sh 

## 2023-03-01 NOTE — Telephone Encounter (Signed)
Pt and pt's daughter informed of pathology results and appointment scheduled for [facility] here in the office.

## 2023-03-11 ENCOUNTER — Ambulatory Visit: Payer: PPO | Admitting: Podiatry

## 2023-03-12 ENCOUNTER — Telehealth: Payer: Self-pay

## 2023-03-12 DIAGNOSIS — R531 Weakness: Secondary | ICD-10-CM

## 2023-03-12 DIAGNOSIS — F0150 Vascular dementia without behavioral disturbance: Secondary | ICD-10-CM

## 2023-03-12 DIAGNOSIS — R42 Dizziness and giddiness: Secondary | ICD-10-CM

## 2023-03-12 DIAGNOSIS — M19019 Primary osteoarthritis, unspecified shoulder: Secondary | ICD-10-CM

## 2023-03-12 DIAGNOSIS — Z9181 History of falling: Secondary | ICD-10-CM

## 2023-03-12 DIAGNOSIS — M47812 Spondylosis without myelopathy or radiculopathy, cervical region: Secondary | ICD-10-CM

## 2023-03-12 NOTE — Telephone Encounter (Signed)
Pt's daughter Stacey [name] given lab results per notes of Edmon Crape, [place] on 03/12/23. Stacey [name] verbalized understanding. She wants both referrals placed, asked if pt could hav home PT vs Outpatient since she does work 10 hr days in [place]. Advised I would send to provider for review and let her know when referrals were placed.

## 2023-03-15 NOTE — Addendum Note (Signed)
Addended by: Debera Lat on: 03/15/2023 06:50 AM   Modules accepted: Orders

## 2023-03-16 ENCOUNTER — Encounter: Payer: Self-pay | Admitting: Physician Assistant

## 2023-03-16 NOTE — Addendum Note (Signed)
Addended by: Debera Lat on: 03/16/2023 01:09 PM   Modules accepted: Orders

## 2023-03-19 ENCOUNTER — Telehealth: Payer: Self-pay | Admitting: Physician Assistant

## 2023-03-19 NOTE — Telephone Encounter (Signed)
Home Health Verbal Orders - Caller/Agency: Debroah Loop from [facility] Callback Number: [phone] Service Requested: Physical Therapy Frequency: 2x1w, 1x2w and 2x1w Any new concerns about the patient? No

## 2023-03-19 NOTE — Telephone Encounter (Signed)
Verbals given  

## 2023-04-02 DIAGNOSIS — M542 Cervicalgia: Secondary | ICD-10-CM | POA: Diagnosis not present

## 2023-04-27 ENCOUNTER — Telehealth: Payer: Self-pay | Admitting: Physician Assistant

## 2023-04-27 DIAGNOSIS — M810 Age-related osteoporosis without current pathological fracture: Secondary | ICD-10-CM

## 2023-04-27 DIAGNOSIS — Z85828 Personal history of other malignant neoplasm of skin: Secondary | ICD-10-CM

## 2023-04-27 DIAGNOSIS — E039 Hypothyroidism, unspecified: Secondary | ICD-10-CM

## 2023-04-27 DIAGNOSIS — G300 Alzheimer's disease with early onset: Secondary | ICD-10-CM | POA: Diagnosis not present

## 2023-04-27 DIAGNOSIS — F0150 Vascular dementia without behavioral disturbance: Secondary | ICD-10-CM | POA: Diagnosis not present

## 2023-04-27 DIAGNOSIS — E782 Mixed hyperlipidemia: Secondary | ICD-10-CM

## 2023-04-27 DIAGNOSIS — F0280 Dementia in other diseases classified elsewhere without behavioral disturbance: Secondary | ICD-10-CM | POA: Diagnosis not present

## 2023-04-27 DIAGNOSIS — M47812 Spondylosis without myelopathy or radiculopathy, cervical region: Secondary | ICD-10-CM | POA: Diagnosis not present

## 2023-04-27 DIAGNOSIS — I10 Essential (primary) hypertension: Secondary | ICD-10-CM

## 2023-04-27 DIAGNOSIS — Z9181 History of falling: Secondary | ICD-10-CM

## 2023-04-27 NOTE — Telephone Encounter (Signed)
Sending back Home Health orders from [facility]. Please sign ASAP and return for faxing.  Okey Regal

## 2023-05-10 ENCOUNTER — Encounter: Payer: Self-pay | Admitting: Dermatology

## 2023-05-10 ENCOUNTER — Ambulatory Visit: Payer: PPO | Admitting: Dermatology

## 2023-05-10 DIAGNOSIS — W908XXA Exposure to other nonionizing radiation, initial encounter: Secondary | ICD-10-CM | POA: Diagnosis not present

## 2023-05-10 DIAGNOSIS — C44319 Basal cell carcinoma of skin of other parts of face: Secondary | ICD-10-CM | POA: Diagnosis not present

## 2023-05-10 DIAGNOSIS — L578 Other skin changes due to chronic exposure to nonionizing radiation: Secondary | ICD-10-CM

## 2023-05-10 DIAGNOSIS — C44310 Basal cell carcinoma of skin of unspecified parts of face: Secondary | ICD-10-CM

## 2023-05-10 NOTE — Progress Notes (Signed)
   Follow-Up Visit   Subjective  Stacey [name] is a 78 y.o. female who presents for the following: pt here for [facility] upper medial eyebrow, pathology report 02/23/23 showed basal cell carcinoma, superficial and nodular patterns.   Pt accompanied by daughter  The patient has spots, moles and lesions to be evaluated, some may be new or changing and the patient may have concern these could be cancer.   The following portions of the chart were reviewed this encounter and updated as appropriate: medications, allergies, medical history  Review of Systems:  No other skin or systemic complaints except as noted in HPI or Assessment and Plan.  Objective  Well appearing patient in no apparent distress; mood and affect are within normal limits.   A focused examination was performed of the following areas: Face  Relevant exam findings are noted in the Assessment and Plan.  L upper medial eyebrow Pink biopsy site   Assessment & Plan   ACTINIC DAMAGE - chronic, secondary to cumulative UV radiation exposure/sun exposure over time - diffuse scaly erythematous macules with underlying dyspigmentation - Recommend daily broad spectrum sunscreen SPF 30+ to sun-exposed areas, reapply every 2 hours as needed.  - Recommend staying in the shade or wearing long sleeves, sun glasses (UVA+UVB protection) and wide brim hats (4-inch brim around the entire circumference of the hat). - Call for new or changing lesions.  BASAL CELL CARCINOMA (BCC), UNSPECIFIED SITE L upper medial eyebrow Destruction of lesion  Destruction method: electrodesiccation and curettage   Informed consent: discussed and consent obtained   Timeout:  patient name, date of birth, surgical site, and procedure verified Anesthesia: the lesion was anesthetized in a standard fashion   Anesthetic:  1% lidocaine  w/ epinephrine  1-100,000 buffered w/ 8.4% NaHCO3 Curettage performed in three different directions: Yes   Electrodesiccation  performed over the curetted area: Yes   Final wound size (cm):  1.5 Hemostasis achieved with:  pressure, aluminum chloride and electrodesiccation Outcome: patient tolerated procedure well with no complications   Post-procedure details: wound care instructions given   Additional details:  Mupirocin ointment and Bandaid applied  Biopsy proven 02/23/23 Pt and daughter aware of resulting scar.  Return in about 6 months (around 11/07/2023) for w/ Dr. Annette Barters, recheck [facility].  I, Jacquelynn Vera, CMA, am acting as scribe for Artemio Larry, MD .   Documentation: I have reviewed the above documentation for accuracy and completeness, and I agree with the above.  Artemio Larry, MD

## 2023-05-10 NOTE — Patient Instructions (Addendum)

## 2023-05-21 ENCOUNTER — Ambulatory Visit: Payer: Self-pay | Admitting: Physician Assistant

## 2023-05-21 ENCOUNTER — Other Ambulatory Visit: Payer: Self-pay | Admitting: Dermatology

## 2023-05-21 DIAGNOSIS — D489 Neoplasm of uncertain behavior, unspecified: Secondary | ICD-10-CM

## 2023-07-12 ENCOUNTER — Other Ambulatory Visit: Payer: Self-pay

## 2023-07-12 ENCOUNTER — Telehealth: Payer: Self-pay | Admitting: Physician Assistant

## 2023-07-12 DIAGNOSIS — I10 Essential (primary) hypertension: Secondary | ICD-10-CM

## 2023-07-12 MED ORDER — METOPROLOL SUCCINATE ER 25 MG PO TB24: 25.0000 mg | ORAL_TABLET | Freq: Every day | ORAL | 1 refills | Status: DC

## 2023-07-12 NOTE — Telephone Encounter (Signed)
[facility] faxed refill request for the following medications:  metoprolol succinate (TOPROL-XL) 25 MG 24 hr tablet  pravastatin (PRAVACHOL) 20 MG tablet  Please advise.

## 2023-07-15 ENCOUNTER — Telehealth: Payer: Self-pay | Admitting: Physician Assistant

## 2023-07-15 DIAGNOSIS — E782 Mixed hyperlipidemia: Secondary | ICD-10-CM

## 2023-07-15 NOTE — Telephone Encounter (Signed)
 [facility] is requesting refill pravastatin (PRAVACHOL) 20 MG tablet   Please advise

## 2023-07-16 MED ORDER — PRAVASTATIN SODIUM 20 MG PO TABS: 20.0000 mg | ORAL_TABLET | Freq: Every day | ORAL | 1 refills | Status: DC

## 2023-08-12 ENCOUNTER — Other Ambulatory Visit: Payer: Self-pay | Admitting: Physician Assistant

## 2023-08-12 DIAGNOSIS — I10 Essential (primary) hypertension: Secondary | ICD-10-CM

## 2023-08-24 ENCOUNTER — Ambulatory Visit: Payer: PPO | Admitting: Dermatology

## 2023-11-07 ENCOUNTER — Other Ambulatory Visit: Payer: Self-pay | Admitting: Physician Assistant

## 2023-11-07 DIAGNOSIS — I10 Essential (primary) hypertension: Secondary | ICD-10-CM

## 2023-11-09 ENCOUNTER — Ambulatory Visit: Payer: PPO | Admitting: Dermatology

## 2023-11-09 DIAGNOSIS — Z85828 Personal history of other malignant neoplasm of skin: Secondary | ICD-10-CM

## 2023-11-09 DIAGNOSIS — W908XXA Exposure to other nonionizing radiation, initial encounter: Secondary | ICD-10-CM | POA: Diagnosis not present

## 2023-11-09 DIAGNOSIS — L578 Other skin changes due to chronic exposure to nonionizing radiation: Secondary | ICD-10-CM

## 2023-11-09 DIAGNOSIS — C44310 Basal cell carcinoma of skin of unspecified parts of face: Secondary | ICD-10-CM

## 2023-11-09 DIAGNOSIS — D485 Neoplasm of uncertain behavior of skin: Secondary | ICD-10-CM

## 2023-11-09 NOTE — Progress Notes (Signed)
   Follow-Up Visit   Subjective  Stacey [name] is a 78 y.o. female who presents for the following: follow-up BCC of the left medial upper eyebrow.   The patient has spots, moles and lesions to be evaluated, some may be new or changing.  This patient is accompanied in the office by her friend.    The following portions of the chart were reviewed this encounter and updated as appropriate: medications, allergies, medical history  Review of Systems:  No other skin or systemic complaints except as noted in HPI or Assessment and Plan.  Objective  Well appearing patient in no apparent distress; mood and affect are within normal limits.  A focused examination was performed of the following areas: Face  Relevant physical exam findings are noted in the Assessment and Plan.  L glabella 9 x 6 mm indistinct light pink slightly pearly macule, medial to scar   Assessment & Plan  ACTINIC DAMAGE - chronic, secondary to cumulative UV radiation exposure/sun exposure over time - diffuse scaly erythematous macules with underlying dyspigmentation - Recommend daily broad spectrum sunscreen SPF 30+ to sun-exposed areas, reapply every 2 hours as needed.  - Recommend staying in the shade or wearing long sleeves, sun glasses (UVA+UVB protection) and wide brim hats (4-inch brim around the entire circumference of the hat). - Call for new or changing lesions.  NEOPLASM OF UNCERTAIN BEHAVIOR OF SKIN L glabella Skin / nail biopsy Type of biopsy: tangential   Informed consent: discussed and consent obtained   Patient was prepped and draped in usual sterile fashion: Area prepped with alcohol. Anesthesia: the lesion was anesthetized in a standard fashion   Anesthetic:  1% lidocaine  w/ epinephrine  1-100,000 buffered w/ 8.4% NaHCO3 Instrument used: flexible razor blade   Hemostasis achieved with: pressure, aluminum chloride and electrodesiccation   Outcome: patient tolerated procedure well    Post-procedure details: wound care instructions given   Post-procedure details comment:  Ointment and small bandage applied  Specimen 1 - Surgical pathology Differential Diagnosis: Scar r/o BCC Check Margins: No  HISTORY OF BASAL CELL CARCINOMA OF THE SKIN Left upper medial eyebrow, EDC 05/10/2023 - possible recurrence at [facility] site, medial to scar, see above for bx performed - Recommend regular full body skin exams - Recommend daily broad spectrum sunscreen SPF 30+ to sun-exposed areas, reapply every 2 hours as needed.  - Call if any new or changing lesions are noted between office visits   Return in about 6 months (around 05/11/2024) for f/u bx.  IAndrea Kerns, CMA, am acting as scribe for Rexene Rattler, MD .   Documentation: I have reviewed the above documentation for accuracy and completeness, and I agree with the above.  Rexene Rattler, MD

## 2023-11-09 NOTE — Patient Instructions (Addendum)

## 2023-11-12 LAB — SURGICAL PATHOLOGY

## 2023-11-15 ENCOUNTER — Ambulatory Visit: Payer: Self-pay | Admitting: Dermatology

## 2023-11-15 ENCOUNTER — Encounter: Payer: Self-pay | Admitting: Dermatology

## 2023-11-15 DIAGNOSIS — C44319 Basal cell carcinoma of skin of other parts of face: Secondary | ICD-10-CM

## 2023-11-15 NOTE — Telephone Encounter (Signed)
 Left message for patient's daughter, Arland, to call back for biopsy results.

## 2023-11-15 NOTE — Telephone Encounter (Signed)
-----   Message from Rexene Rattler sent at 11/15/2023 10:40 AM EDT ----- 1. Skin, L glabella :       BASAL CELL CARCINOMA, SUPERFICIAL AND NODULAR PATTERNS   Recurrent BCC- recommend Mohs surgery with Dr. Corey - please call patient  Mohs surgery involves cutting out right around the spot and then checking under the microscope to be sure the whole skin cancer is out. The cure rate is about 98-99%. It is done at another office  outside of [place] ([place], [place], or [place]) by a specialist. Once the [employer] confirms the skin cancer is out, they will discuss the options to repair or heal the area. Often  surgical reconstruction of the area is necessary and is done at the same visit after the skin cancer has been removed.  You must take it easy for about two weeks after surgery (no lifting over 10-15  lbs, avoid activity to get your heart rate and blood pressure up).   ----- Message ----- From: Interface, Lab In [facility] Sent: 11/12/2023   6:14 PM EDT To: Rexene Rattler, MD

## 2023-11-16 NOTE — Telephone Encounter (Signed)
 Patients daughter Arland advised of BX results and OK with referral to [facility] with Dr. Paci. aw

## 2023-11-16 NOTE — Addendum Note (Signed)
 Addended by: TERESA PALMA R on: 11/16/2023 11:19 AM   Modules accepted: Orders

## 2023-11-30 ENCOUNTER — Other Ambulatory Visit: Payer: Self-pay | Admitting: Family Medicine

## 2023-11-30 ENCOUNTER — Ambulatory Visit
Admission: RE | Admit: 2023-11-30 | Discharge: 2023-11-30 | Disposition: A | Discharge status: Home / Self Care | Source: Ambulatory Visit | Attending: Family Medicine | Admitting: Family Medicine

## 2023-11-30 ENCOUNTER — Telehealth: Payer: Self-pay

## 2023-11-30 DIAGNOSIS — M5412 Radiculopathy, cervical region: Secondary | ICD-10-CM | POA: Insufficient documentation

## 2023-11-30 NOTE — Telephone Encounter (Signed)
 FYI - Whitney called me about this patient. She is going to send over a referral. The patient has Alzheimer's. Came in for neck pain for years. CT shows a chronic C1 fracture. She is not in a collar. No known injury.  OK to schedule w/ a PA soon, but do not need to overbook PA schedule this week if PA's are full. I reviewed with Dr Clois.

## 2023-12-01 NOTE — Telephone Encounter (Signed)
 Patient is scheduled at 1:00PM on September 16th

## 2023-12-01 NOTE — Telephone Encounter (Signed)
 Image request was sent to [facility].

## 2023-12-02 ENCOUNTER — Inpatient Hospital Stay
Admission: RE | Admit: 2023-12-02 | Discharge: 2023-12-02 | Disposition: A | Discharge status: Home / Self Care | Payer: Self-pay | Source: Ambulatory Visit | Attending: Physician Assistant | Admitting: Physician Assistant

## 2023-12-02 ENCOUNTER — Other Ambulatory Visit: Payer: Self-pay | Admitting: Physician Assistant

## 2023-12-02 ENCOUNTER — Other Ambulatory Visit: Payer: Self-pay

## 2023-12-02 DIAGNOSIS — Z049 Encounter for examination and observation for unspecified reason: Secondary | ICD-10-CM

## 2023-12-02 DIAGNOSIS — I10 Essential (primary) hypertension: Secondary | ICD-10-CM

## 2023-12-02 NOTE — Telephone Encounter (Signed)
 Images received and uploaded to the chart

## 2023-12-10 NOTE — Progress Notes (Unsigned)
 Referring Physician:  Carlisle Benton CROME, FNP [address] [place],  [place] [address]  Primary Physician:  [name], Janna, PA-C  History of Present Illness: 12/14/2023 [name] is here today with a chief complaint of chronic type II odontoid fracture.  HPI was significantly limited secondary to patient's dementia.  She is pleasantly demented and has a family friend here to help.  Per the family patient had a fall last year in which she hit her head and when it is believed that she had the initial fracture-approximately 18 months ago.  She has been complaining of pain in her neck on and off and was later found to have a chronic odontoid fracture.  Patient is unable to give a clear picture how often or how severe her pain is.     Weakness: none  Bowel/Bladder Dysfunction: none  Conservative measures: has been seen by a chiropractor in the past Physical therapy: *** has not participated in?? Multimodal medical therapy including regular antiinflammatories: *** tylenol , medrol dosepak  Injections: *** no epidural steroid injections?  Past Surgery: ***no spinal surgeries?  Rock [name] Many has ***no symptoms of cervical myelopathy.  The symptoms are causing a significant impact on the patient's life.   Review of Systems:  A 10 point review of systems is negative, except for the pertinent positives and negatives detailed in the HPI.  Past Medical History: Past Medical History:  Diagnosis Date   Actinic keratosis 02/28/2010   Dorsum nose.   Basal cell carcinoma 05/04/2016   Left sup nasolabial area near nose. Nodular pattern. Re-shave and EDC 06/16/2016   Basal cell carcinoma 02/23/2023   Left upper medial eyebrow, ED&C 05/10/23   Basal cell carcinoma 11/09/2023   left glabella, needs Mohs scheduled w/Dr Stacey [name]   Hyperlipidemia    Hypertension    Olecranon bursitis    left    Osteoporosis    Squamous cell carcinoma of skin 05/20/2022   Right Zygomatic Area,  [facility]    Past Surgical History: Past Surgical History:  Procedure Laterality Date   ABDOMINAL HYSTERECTOMY     INTRAMEDULLARY (IM) NAIL INTERTROCHANTERIC Right 02/05/2015   Procedure: INTRAMEDULLARY (IM) NAIL INTERTROCHANTRIC;  Surgeon: Stacey JINNY Maltos, MD;  Location: [facility] ORS;  Service: Orthopedics;  Laterality: Right;    Allergies: Allergies as of 12/14/2023   (No Known Allergies)    Medications: Outpatient Encounter Medications as of 12/14/2023  Medication Sig   acetaminophen  (TYLENOL ) 325 MG tablet Take 2 tablets (650 mg total) by mouth every 6 (six) hours as needed for mild pain (or Fever >/= 101).   Calcium  Citrate-Vitamin D  (CALCIUM  + D PO) Take 1 tablet by mouth daily.   donepezil  (ARICEPT ) 5 MG tablet TAKE ONE TABLET BY MOUTH EVERY NIGHT AT BEDTIME   lamoTRIgine (LAMICTAL) 150 MG tablet Take 150 mg by mouth daily.   lisinopril  (ZESTRIL ) 5 MG tablet TAKE 1 TABLET BY MOUTH DAILY   memantine (NAMENDA) 5 MG tablet Take 5 mg by mouth 2 (two) times daily.   metoprolol  succinate (TOPROL -XL) 25 MG 24 hr tablet Take 1 tablet (25 mg total) by mouth daily.   mirtazapine (REMERON) 15 MG tablet Take 15 mg by mouth at bedtime.   mometasone  (ELOCON ) 0.1 % cream APPLY TOPICALLY TO ITCHY AREAS ON BACK 2 TIMES A DAY AS NEEDED UNITL CLEAR   Multiple Vitamin (MULTI-VITAMIN DAILY PO) Take 1 tablet by mouth daily.   pravastatin  (PRAVACHOL ) 20 MG tablet Take 1 tablet (20 mg total) by mouth  daily.   No facility-administered encounter medications on file as of 12/14/2023.    Social History: Social History   Tobacco Use   Smoking status: Never   Smokeless tobacco: Never  Substance Use Topics   Alcohol use: No   Drug use: No    Family Medical History: Family History  Problem Relation Age of Onset   Cancer Mother        colon   Cancer Father        lung cancer    Physical Examination: @VITALWITHPAIN @  General: Patient is well developed, well nourished, calm, collected, and in no  apparent distress. Attention to examination is appropriate.  Psychiatric: Patient is non-anxious.  Head:  Pupils equal, round, and reactive to light.  ENT:  Oral mucosa appears well hydrated.  Neck:   Supple.  ***Full range of motion.  Respiratory: Patient is breathing without any difficulty.  Extremities: No edema.  Vascular: Palpable dorsal pedal pulses.  Skin:   On exposed skin, there are no abnormal skin lesions.  NEUROLOGICAL:     Awake, alert, oriented to person, place, and time.  Speech is clear and fluent. Fund of knowledge is appropriate.   *** not oriented ot place or time.   Cranial Nerves: Pupils equal round and reactive to light.  Facial tone is symmetric.  Facial sensation is symmetric.  ROM of spine: ***full.  Palpation of spine: ***non tender.      Strength: Side Biceps Triceps Deltoid Interossei Grip Wrist Ext. Wrist Flex.  R 5 5 5 5 5 5 5   L 5 5 5 5 5 5 5    Side Iliopsoas Quads Hamstring PF DF EHL  R 5 5 5 5 5 5   L 5 5 5 5 5 5    Reflexes are ***2+ and symmetric at the biceps, triceps, brachioradialis, patella and achilles.   Hoffman's is absent.  Clonus is not present.  Toes are down-going.  Bilateral upper and lower extremity sensation is intact to light touch.    Gait is normal.   No difficulty with tandem gait.   No evidence of dysmetria noted.  Medical Decision Making  Imaging: ***  I have personally reviewed the images and agree with the above interpretation.  Assessment and Plan: Stacey [name] is a pleasant 78 y.o. female with ***    Thank you for involving me in the care of this patient.   I spent a total of *** minutes in both face-to-face and non-face-to-face activities for this visit on the date of this encounter.   Stacey Decamp, PA-C Dept. of Neurosurgery

## 2023-12-14 ENCOUNTER — Ambulatory Visit (INDEPENDENT_AMBULATORY_CARE_PROVIDER_SITE_OTHER): Admitting: Physician Assistant

## 2023-12-14 ENCOUNTER — Encounter: Payer: Self-pay | Admitting: Physician Assistant

## 2023-12-14 ENCOUNTER — Encounter: Payer: Self-pay | Admitting: Dermatology

## 2023-12-14 VITALS — BP 110/78 | Ht 66.0 in | Wt 127.2 lb

## 2023-12-14 DIAGNOSIS — F0390 Unspecified dementia without behavioral disturbance: Secondary | ICD-10-CM

## 2023-12-14 DIAGNOSIS — M542 Cervicalgia: Secondary | ICD-10-CM

## 2023-12-14 DIAGNOSIS — F0150 Vascular dementia without behavioral disturbance: Secondary | ICD-10-CM

## 2023-12-14 DIAGNOSIS — S12100K Unspecified displaced fracture of second cervical vertebra, subsequent encounter for fracture with nonunion: Secondary | ICD-10-CM

## 2023-12-14 DIAGNOSIS — W19XXXA Unspecified fall, initial encounter: Secondary | ICD-10-CM

## 2023-12-14 DIAGNOSIS — S12112A Nondisplaced Type II dens fracture, initial encounter for closed fracture: Secondary | ICD-10-CM | POA: Diagnosis not present

## 2023-12-16 ENCOUNTER — Ambulatory Visit: Admitting: Dermatology

## 2023-12-16 ENCOUNTER — Encounter: Payer: Self-pay | Admitting: Dermatology

## 2023-12-16 VITALS — BP 133/80 | HR 66 | Temp 97.9°F

## 2023-12-16 DIAGNOSIS — L578 Other skin changes due to chronic exposure to nonionizing radiation: Secondary | ICD-10-CM | POA: Diagnosis not present

## 2023-12-16 DIAGNOSIS — L814 Other melanin hyperpigmentation: Secondary | ICD-10-CM | POA: Diagnosis not present

## 2023-12-16 DIAGNOSIS — C4491 Basal cell carcinoma of skin, unspecified: Secondary | ICD-10-CM

## 2023-12-16 DIAGNOSIS — C44319 Basal cell carcinoma of skin of other parts of face: Secondary | ICD-10-CM | POA: Diagnosis not present

## 2023-12-16 MED ORDER — TRAMADOL HCL 50 MG PO TABS: 50.0000 mg | ORAL_TABLET | Freq: Four times a day (QID) | ORAL | 0 refills | Status: AC | PRN

## 2023-12-16 NOTE — Progress Notes (Signed)
 Follow-Up Visit   Subjective  Stacey [name] is a 78 y.o. female who presents for the following: Mohs of recurrent Superficial and Nodular Basal Cell Carcinoma left glabella, referred by Dr. Jackquline. Patient is accompanied by her daughter. Patient's daughter states lesion was previously treated with scrape and burn.   The following portions of the chart were reviewed this encounter and updated as appropriate: medications, allergies, medical history  Review of Systems:  No other skin or systemic complaints except as noted in HPI or Assessment and Plan.  Objective  Well appearing patient in no apparent distress; mood and affect are within normal limits.  A focused examination was performed of the following areas: Left glabella Relevant physical exam findings are noted in the Assessment and Plan.   left Glabella Atrophic pink plaque   Assessment & Plan   BASAL CELL CARCINOMA (BCC), UNSPECIFIED SITE left Glabella Mohs surgery  Consent obtained: written  Anticoagulation: Is the patient taking prescription anticoagulant and/or aspirin prescribed/recommended by a physician? No   Was the anticoagulation regimen changed prior to Mohs? No    Anesthesia: Anesthesia method: local infiltration Local anesthetic: lidocaine  1% WITH epi  Procedure Details: Timeout: pre-procedure verification complete Procedure Prep: patient was prepped and draped in usual sterile fashion Prep type: chlorhexidine Biopsy accession number: [id number] Specimen debulked: No   Pre-Op diagnosis: basal cell carcinoma BCC subtype: superficial and nodular MohsAIQ Surgical site (if tumor spans multiple areas, please select predominant area): forehead (non-eyebrow) Surgery side: left Surgical site (from skin exam): left Glabella Pre-operative length (cm): 1.8 Pre-operative width (cm): 1.1 Indications for Mohs surgery: anatomic location where tissue conservation is critical and  recurrence  Micrographic Surgery Details: Post-operative length (cm): 3.8 Post-operative width (cm): 3.5 Number of Mohs stages: 3 Post surgery depth of defect: dermis and subcutaneous fat  Stage 1    Tumor features identified on Mohs section: basal carcinoma    Depth of tumor invasion after stage: dermis and subcutaneous fat  Stage 2    Tumor features identified on Mohs section: basal carcinoma    Depth of tumor invasion after stage: dermis and subcutaneous fat  Stage 3    Tumor features identified on Mohs section: no tumor identified  Reconstruction: Was the defect reconstructed? Yes   Was reconstruction performed by the same Mohs surgeon? Yes   Setting of reconstruction: outpatient office When was reconstruction performed? same day Type of reconstruction: linear and flap Linear reconstruction: complex Type of flap: advancement   Advancement flap type: unilateral single arm  Skin repair Complexity:  Complex Final length (cm):  3 (x 4.2 and complex 3.0cm) Informed consent: discussed and consent obtained   Timeout: patient name, date of birth, surgical site, and procedure verified   Procedure prep:  Patient was prepped and draped in usual sterile fashion Prep type:  Chlorhexidine Anesthesia: the lesion was anesthetized in a standard fashion   Anesthetic:  1% lidocaine  w/ epinephrine  1-100,000 buffered w/ 8.4% NaHCO3 Reason for type of repair: reduce tension to allow closure, preserve normal anatomical and functional relationships, avoid adjacent structures and allow side-to-side closure without requiring a flap or graft   Undermining: area extensively undermined   Subcutaneous layers (deep stitches):  Suture size:  5-0 Suture type: Monocryl (poliglecaprone 25)   Stitches:  Buried vertical mattress Fine/surface layer approximation (top stitches):  Suture size:  5-0 Suture type: Prolene (polypropylene)   Stitches: simple running   Hemostasis achieved with: suture,  pressure and electrodesiccation Outcome: patient tolerated procedure well  with no complications   Post-procedure details: sterile dressing applied and wound care instructions given   Dressing type: petrolatum and pressure dressing    Related Medications traMADol  (ULTRAM ) 50 MG tablet Take 1 tablet (50 mg total) by mouth every 6 (six) hours as needed for up to 10 doses.   Return in about 7 years (around 12/16/2030) for wound check/suture removal.  I, Darice Smock, CMA, am acting as scribe for RUFUS CHRISTELLA HOLY, MD.    12/16/2023  HISTORY OF PRESENT ILLNESS  Stacey [name] is seen in consultation at the request of Dr. Jackquline for biopsy-proven Nodular and Superficial Basal Cell Carcinoma on the left glabella.  They note that the area has been present for about 2-3 years increasing in size with time.  There is no history of previous treatment.  Reports no other new or changing lesions and has no other complaints today.  Medications and allergies: see patient chart.  Review of systems: Reviewed 8 systems and notable for the above skin cancer.  All other systems reviewed are unremarkable/negative, unless noted in the HPI. Past medical history, surgical history, family history, social history were also reviewed and are noted in the chart/questionnaire.    PHYSICAL EXAMINATION  General: Well-appearing, in no acute distress, alert and oriented x 4. Vitals reviewed in chart (if available).   Skin: Exam reveals a 1.8 x 1.1 cm erythematous papule and biopsy scar on the left glabella. There are rhytids, telangiectasias, and lentigines, consistent with photodamage.   Biopsy report(s) reviewed, confirming the diagnosis.   ASSESSMENT  1) Nodular and superficial Basal Cell Carcinoma  2) photodamage 3) solar lentigines   PLAN   1. Due to location, size, histology, or recurrence and the likelihood of subclinical extension as well as the need to conserve normal surrounding tissue, the patient  was deemed acceptable for Mohs micrographic surgery (MMS).  The nature and purpose of the procedure, associated benefits and risks including recurrence and scarring, possible complications such as pain, infection, and bleeding, and alternative methods of treatment if appropriate were discussed with the patient during consent. The lesion location was verified by the patient, by reviewing previous notes, pathology reports, and by photographs as well as angulation measurements if available.  Informed consent was reviewed and signed by the patient, and timeout was performed at 8:15 AM. See op note below.  2. For the photodamage and solar lentigines, sun protection discussed/information given on OTC sunscreens, and we recommend continued regular follow-up with primary dermatologist every 6 months or sooner for any growing, bleeding, or changing lesions. 3. Prognosis and future surveillance discussed. 4. Letter with treatment outcome sent to referring provider. 5. Pain acetaminophen /ibuprofen/tramadol  50 mg  MOHS MICROGRAPHIC SURGERY AND RECONSTRUCTION  Initial size:   1.8 x 1.1 cm Surgical defect/wound size: 3.8 x 3.5 cm Anesthesia:    0.33% lidocaine  with 1:200,000 epinephrine  EBL:    <5 mL Complications:  None Repair type:   Adjacent Tissue Transfer (Single Tangent Advancement) + Complex Closure SQ suture:   5-0 and 4-0 Monocryl Cutaneous suture:  5-0 Polyprolene Final size of the repair: Flap: 6.0 x 4.2 = 25.2 cm^2 Complex: 3.0  Stages: 3  STAGE I: Anesthesia achieved with 0.5% lidocaine  with 1:200,000 epinephrine . ChloraPrep applied. 1 section(s) excised using Mohs technique (this includes total peripheral and deep tissue margin excision and evaluation with frozen sections, excised and interpreted by the same physician). The tumor was first debulked and then excised with an approx. 2 mm margin.  Hemostasis was  achieved with electrocautery as needed.  The specimen was then oriented,  subdivided/relaxed, inked, and processed using Mohs technique.    Frozen section analysis revealed a positive margin for thin cords or strands of basaloid tumor cells that deeply infiltrate the surrounding stroma, often with irregular, angulated edges, appearing as a permeating invasion pattern at the tumor margins; these strands are embedded within a dense, collagenous stroma, with less prominent peripheral palisading and retraction in the peripheral margin.    STAGE II: An additional 2 mm margin was excised.  Hemostasis was achieved with electrocautery as needed.  The specimen was then oriented, subdivided/relaxed, inked, and processed using Mohs technique.   Frozen section analysis revealed a positive margin for thin cords or strands of basaloid tumor cells that deeply infiltrate the surrounding stroma, often with irregular, angulated edges, appearing as a permeating invasion pattern at the tumor margins; these strands are embedded within a dense, collagenous stroma, with less prominent peripheral palisading and retraction in the peripheral margin.  STAGE III: An additional 2 mm margin was excised.  Hemostasis was achieved with electrocautery as needed.  The specimen was then oriented, subdivided/relaxed, inked, and processed using Mohs technique. Evaluation of slides by the Mohs surgeon revealed clear tumor margins.   Reconstruction (Combination of Adjacent Tissue and Complex Closure)  The surgical wound was then cleaned, prepped, and re-anesthetized as above. Wound edges were undermined extensively along at least one entire edge and at a distance equal to or greater than the width of the defect (see wound defect size above) in order to achieve closure and decrease wound tension and anatomic distortion. Redundant tissue repair including standing cone removal was performed. Hemostasis was achieved with electrocautery. Subcutaneous and epidermal tissues were approximated with the above sutures. The  surgical site was then lightly scrubbed with sterile, saline-soaked gauze. The area was then bandaged using Vaseline ointment, non-adherent gauze, gauze pads, and tape to provide an adequate pressure dressing. The patient tolerated the procedure well, was given detailed written and verbal wound care instructions, and was discharged in good condition.   PROCEDURE: Advancement Flap The nature of the procedure was discussed with the patient in detail, including alternatives.  The risks discussed included but not limited to potential for infection, bleeding, scar formation, and damage to underlying structures.  The patient understood the risks and signed the consent form (scanned into chart).  This wound was reconstructed with an advancement flap.  Local anesthesia was achieved with the anesthetic indicated above.  The operative site was prepped with a surgical antiseptic solution, and then draped with sterile towels to insure a sterile field.  The beveled edges of the wound were excised to 90 degrees relative to the surface skin plane.  The wound was undermined in all directions, and meticulous hemostasis was achieved with an electrosurgical device.  Relaxing incisions were made, if necessary, for lateral tension release.  The tissue was advanced and closed centrally, and redundant tissue was trimmed as necessary.  The wound was sutured in a layered fashion to close potential dead space and to precisely and securely approximate the wound edges.  The dimensions of the flap were:  6.0 cm x 4.2 cm for a total flap surface area of 25.2 centimeters squared (cm2).    The wound was covered with petrolatum and a dressing.  The patient understands the need to return immediately for any signs of infection to include swelling, pain, purulent discharge, localized warmth, or fever.  Contact information was provided to the patient (  including after-hours pager numbers)    The patient will follow-up: 7 days for suture  removal    Documentation: I have reviewed the above documentation for accuracy and completeness, and I agree with the above.  RUFUS CHRISTELLA HOLY, MD

## 2023-12-16 NOTE — Patient Instructions (Signed)

## 2023-12-21 ENCOUNTER — Ambulatory Visit (INDEPENDENT_AMBULATORY_CARE_PROVIDER_SITE_OTHER): Admitting: Physician Assistant

## 2023-12-21 DIAGNOSIS — S12112D Nondisplaced Type II dens fracture, subsequent encounter for fracture with routine healing: Secondary | ICD-10-CM

## 2023-12-21 DIAGNOSIS — S12100K Unspecified displaced fracture of second cervical vertebra, subsequent encounter for fracture with nonunion: Secondary | ICD-10-CM

## 2023-12-21 NOTE — Progress Notes (Signed)
 Spoke with patient's daughter on the phone regarding plan moving forward.  Due to patient's dementia, would not want move forward with a high cervical injection or surgery.  Plan would be to follow-up with the pain clinic for possible injections just to optimize her current state he does little pain as possible.  I am in agreement with this plan.  We did discuss risk of paralysis or further injury.  Encouraged the family to reach out to me for any additional questions or concerns that they have.  I have encouraged them to try to reduce her fall risk is much as possible in order to reduce further risk of injury.

## 2023-12-23 ENCOUNTER — Encounter: Payer: Self-pay | Admitting: Dermatology

## 2023-12-23 ENCOUNTER — Ambulatory Visit (INDEPENDENT_AMBULATORY_CARE_PROVIDER_SITE_OTHER): Admitting: Dermatology

## 2023-12-23 VITALS — BP 144/98

## 2023-12-23 DIAGNOSIS — C4491 Basal cell carcinoma of skin, unspecified: Secondary | ICD-10-CM

## 2023-12-23 DIAGNOSIS — Z85828 Personal history of other malignant neoplasm of skin: Secondary | ICD-10-CM

## 2023-12-23 DIAGNOSIS — T1490XD Injury, unspecified, subsequent encounter: Secondary | ICD-10-CM

## 2023-12-23 DIAGNOSIS — Z48817 Encounter for surgical aftercare following surgery on the skin and subcutaneous tissue: Secondary | ICD-10-CM

## 2023-12-23 MED ORDER — DOXYCYCLINE HYCLATE 100 MG PO TABS: 100.0000 mg | ORAL_TABLET | Freq: Two times a day (BID) | ORAL | 0 refills | Status: AC

## 2023-12-23 MED ORDER — MUPIROCIN 2 % EX OINT
1.0000 | TOPICAL_OINTMENT | Freq: Two times a day (BID) | CUTANEOUS | 3 refills | Status: AC
Start: 2023-12-23 — End: ?

## 2023-12-23 NOTE — Patient Instructions (Signed)

## 2023-12-23 NOTE — Progress Notes (Signed)
   Follow Up Visit   Subjective  Stacey [name] is a 78 y.o. 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 The patient has been bandaging the wound as directed. The endorse the following concerns: none. Patient is accompanied by her granddaughter.  The following portions of the chart were reviewed this encounter and updated as appropriate: medications, allergies, medical history  Review of Systems:  No other skin or systemic complaints except as noted in HPI or Assessment and Plan.  Objective  Well appearing patient in no apparent distress; mood and affect are within normal limits.  A focal examination was performed including the glabella. All findings within normal limits unless otherwise noted below.  Healing wound with mild erythema  Relevant physical exam findings are noted in the Assessment and Plan.       Assessment & Plan   Healing Wound s/p Mohs for a BCC on the left glabella, treated on 12/16/2023, repaired with a complex linear closure and Advancement Flap - Sutures removed today - Reassured that wound is healing well - No evidence of infection - Swelling, redness and irritation noted.  - Ok to continue ointment daily to wound under a bandage for another 2 weeks - Doxycycline  100 mg BID for 7 days - Will add mupirocin  BID to wound under bandage  HISTORY OF BASAL CELL CARCINOMA OF THE SKIN - No evidence of recurrence today - Recommend regular full body skin exams - Recommend daily broad spectrum sunscreen SPF 30+ to sun-exposed areas, reapply every 2 hours as needed.  - Call if any new or changing lesions are noted between office visits    Return in about 2 weeks (around 01/06/2024) for Wound Check.  LILLETTE Rollene Gobble, RN, am acting as scribe for RUFUS CHRISTELLA HOLY, MD  .   Documentation: I have reviewed the above documentation for accuracy and completeness, and I agree with the above.  RUFUS CHRISTELLA HOLY, MD

## 2024-01-04 ENCOUNTER — Other Ambulatory Visit: Payer: Self-pay | Admitting: Physician Assistant

## 2024-01-04 DIAGNOSIS — I10 Essential (primary) hypertension: Secondary | ICD-10-CM

## 2024-01-05 ENCOUNTER — Ambulatory Visit: Payer: PPO | Admitting: Emergency Medicine

## 2024-01-05 VITALS — Ht 62.0 in | Wt 125.0 lb

## 2024-01-05 DIAGNOSIS — Z0000 Encounter for general adult medical examination without abnormal findings: Secondary | ICD-10-CM

## 2024-01-05 DIAGNOSIS — Z780 Asymptomatic menopausal state: Secondary | ICD-10-CM

## 2024-01-05 NOTE — Patient Instructions (Signed)
 Stacey [name],  Thank you for taking the time for your Medicare Wellness Visit. I appreciate your continued commitment to your health goals. Please review the care plan we discussed, and feel free to reach out if I can assist you further.  Medicare recommends these wellness visits once per year to help you and your care team stay ahead of potential health issues. These visits are designed to focus on prevention, allowing your provider to concentrate on managing your acute and chronic conditions during your regular appointments.  Please note that Annual Wellness Visits do not include a physical exam. Some assessments may be limited, especially if the visit was conducted virtually. If needed, we may recommend a separate in-person follow-up with your provider.  Ongoing Care Seeing your primary care provider every 3 to 6 months helps us  monitor your health and provide consistent, personalized care.   Referrals If a referral was made during today's visit and you haven't received any updates within two weeks, please contact the referred provider directly to check on the status.  Recommended Screenings:  Get the flu and pneumonia vaccine at your next OV on 01/14/24 Get the Shingles vaccines at your local pharmacy at your convenience. Please call to schedule your bone density scan:  [facility] Address: [address], [place], [place] Phone: [phone]    Health Maintenance  Topic Date Due   Zoster (Shingles) Vaccine (1 of 2) Never done   Pneumococcal Vaccine for age over 67 (1 of 1 - PCV) Never done   Flu Shot  10/29/2023   COVID-19 Vaccine (4 - 2025-26 season) 11/29/2023   Medicare Annual Wellness Visit  01/04/2025   DTaP/Tdap/Td vaccine (3 - Td or Tdap) 06/06/2030   DEXA scan (bone density measurement)  Completed   Hepatitis C Screening  Completed   Meningitis B Vaccine  Aged Out   Cologuard (Stool DNA test)  Discontinued        01/05/2024    4:02 PM  Advanced Directives  Does Patient Have a Medical Advance Directive? No  Would patient like information on creating a medical advance directive? Yes (MAU/Ambulatory/Procedural Areas - Information given)   Advance Care Planning is important because it: Ensures you receive medical care that aligns with your values, goals, and preferences. Provides guidance to your family and loved ones, reducing the emotional burden of decision-making during critical moments.  Vision: Annual vision screenings are recommended for early detection of glaucoma, cataracts, and diabetic retinopathy. These exams can also reveal signs of chronic conditions such as diabetes and high blood pressure.  Dental: Annual dental screenings help detect early signs of oral cancer, gum disease, and other conditions linked to overall health, including heart disease and diabetes.  Please see the attached documents for additional preventive care recommendations.   Fall Prevention in the Home, Adult Falls can cause injuries and affect people of all ages. There are many simple things that you can do to make your home safe and to help prevent falls. If you need it, ask for help making these changes. What actions can I take to prevent falls? General information Use good lighting in all rooms. Make sure to: Replace any light bulbs that burn out. Turn on lights if it is dark and use night-lights. Keep items that you use often in easy-to-reach places. Lower the shelves around your home if needed. Move furniture so that there are clear paths around it. Do not keep throw rugs or other things on  the floor that can make you trip. If any of your floors are uneven, fix them. Add color or contrast paint or tape to clearly mark and help you see: Grab bars or handrails. First and last steps of staircases. Where the edge of each step is. If you use a ladder or stepladder: Make sure that it is fully opened. Do not climb a  closed ladder. Make sure the sides of the ladder are locked in place. Have someone hold the ladder while you use it. Know where your pets are as you move through your home. What can I do in the bathroom?     Keep the floor dry. Clean up any water that is on the floor right away. Remove soap buildup in the bathtub or shower. Buildup makes bathtubs and showers slippery. Use non-skid mats or decals on the floor of the bathtub or shower. Attach bath mats securely with double-sided, non-slip rug tape. If you need to sit down while you are in the shower, use a non-slip stool. Install grab bars by the toilet and in the bathtub and shower. Do not use towel bars as grab bars. What can I do in the bedroom? Make sure that you have a light by your bed that is easy to reach. Do not use any sheets or blankets on your bed that hang to the floor. Have a firm bench or chair with side arms that you can use for support when you get dressed. What can I do in the kitchen? Clean up any spills right away. If you need to reach something above you, use a sturdy step stool that has a grab bar. Keep electrical cables out of the way. Do not use floor polish or wax that makes floors slippery. What can I do with my stairs? Do not leave anything on the stairs. Make sure that you have a light switch at the top and the bottom of the stairs. Have them installed if you do not have them. Make sure that there are handrails on both sides of the stairs. Fix handrails that are broken or loose. Make sure that handrails are as long as the staircases. Install non-slip stair treads on all stairs in your home if they do not have carpet. Avoid having throw rugs at the top or bottom of stairs, or secure the rugs with carpet tape to prevent them from moving. Choose a carpet design that does not hide the edge of steps on the stairs. Make sure that carpet is firmly attached to the stairs. Fix any carpet that is loose or worn. What can  I do on the outside of my home? Use bright outdoor lighting. Repair the edges of walkways and driveways and fix any cracks. Clear paths of anything that can make you trip, such as tools or rocks. Add color or contrast paint or tape to clearly mark and help you see high doorway thresholds. Trim any bushes or trees on the main path into your home. Check that handrails are securely fastened and in good repair. Both sides of all steps should have handrails. Install guardrails along the edges of any raised decks or porches. Have leaves, snow, and ice cleared regularly. Use sand, salt, or ice melt on walkways during winter months if you live where there is ice and snow. In the garage, clean up any spills right away, including grease or oil spills. What other actions can I take? Review your medicines with your health care provider. Some medicines can make  you confused or feel dizzy. This can increase your chance of falling. Wear closed-toe shoes that fit well and support your feet. Wear shoes that have rubber soles and low heels. Use a cane, walker, scooter, or crutches that help you move around if needed. Talk with your provider about other ways that you can decrease your risk of falls. This may include seeing a physical therapist to learn to do exercises to improve movement and strength. Where to find more information Centers for Disease Control and Prevention, [employer]: TonerPromos.no [employer] on Aging: BaseRingTones.pl National Institute on Aging: BaseRingTones.pl Contact a health care provider if: You are afraid of falling at home. You feel weak, drowsy, or dizzy at home. You fall at home. Get help right away if you: Lose consciousness or have trouble moving after a fall. Have a fall that causes a head injury. These symptoms may be an emergency. Get help right away. Call 911. Do not wait to see if the symptoms will go away. Do not drive yourself to the hospital. This information is not intended to  replace advice given to you by your health care provider. Make sure you discuss any questions you have with your health care provider. Document Revised: 11/17/2021 Document Reviewed: 11/17/2021 Elsevier Patient Education  2024 Else[employer] Inc.  Managing Pain Without Opioids Opioids are strong medicines used to treat moderate to severe pain. For some people, especially those who have long-term (chronic) pain, opioids may not be the best choice for pain management due to: Side effects like nausea, constipation, and sleepiness. The risk of addiction (opioid use disorder). The longer you take opioids, the greater your risk of addiction. Pain that lasts for more than 3 months is called chronic pain. Managing chronic pain usually requires more than one approach and is often provided by a team of health care providers working together (multidisciplinary approach). Pain management may be done at a pain management center or pain clinic. How to manage pain without the use of opioids Use non-opioid medicines Non-opioid medicines for pain may include: Over-the-counter or prescription non-steroidal anti-inflammatory drugs (NSAIDs). These may be the first medicines used for pain. They work well for muscle and bone pain, and they reduce swelling. Acetaminophen . This over-the-counter medicine may work well for milder pain but not swelling. Antidepressants. These may be used to treat chronic pain. A certain type of antidepressant (tricyclics) is often used. These medicines are given in lower doses for pain than when used for depression. Anticonvulsants. These are usually used to treat seizures but may also reduce nerve (neuropathic) pain. Muscle relaxants. These relieve pain caused by sudden muscle tightening (spasms). You may also use a pain medicine that is applied to the skin as a patch, cream, or gel (topical analgesic), such as a numbing medicine. These may cause fewer side effects than medicines taken by  mouth. Do certain therapies as directed Some therapies can help with pain management. They include: Physical therapy. You will do exercises to gain strength and flexibility. A physical therapist may teach you exercises to move and stretch parts of your body that are weak, stiff, or painful. You can learn these exercises at physical therapy visits and practice them at home. Physical therapy may also involve: Massage. Heat wraps or applying heat or cold to affected areas. Electrical signals that interrupt pain signals (transcutaneous electrical nerve stimulation, TENS). Weak lasers that reduce pain and swelling (low-level laser therapy). Signals from your body that help you learn to regulate pain (biofeedback). Occupational therapy. This  helps you to learn ways to function at home and work with less pain. Recreational therapy. This involves trying new activities or hobbies, such as a physical activity or drawing. Mental health therapy, including: Cognitive behavioral therapy (CBT). This helps you learn coping skills for dealing with pain. Acceptance and commitment therapy (ACT) to change the way you think and react to pain. Relaxation therapies, including muscle relaxation exercises and mindfulness-based stress reduction. Pain management counseling. This may be individual, family, or group counseling.  Receive medical treatments Medical treatments for pain management include: Nerve block injections. These may include a pain blocker and anti-inflammatory medicines. You may have injections: Near the spine to relieve chronic back or neck pain. Into joints to relieve back or joint pain. Into nerve areas that supply a painful area to relieve body pain. Into muscles (trigger point injections) to relieve some painful muscle conditions. A medical device placed near your spine to help block pain signals and relieve nerve pain or chronic back pain (spinal cord stimulation device). Acupuncture. Follow  these instructions at home Medicines Take over-the-counter and prescription medicines only as told by your health care provider. If you are taking pain medicine, ask your health care providers about possible side effects to watch out for. Do not drive or use heavy machinery while taking prescription opioid pain medicine. Lifestyle  Do not use drugs or alcohol to reduce pain. If you drink alcohol, limit how much you have to: 0-1 drink a day for women who are not pregnant. 0-2 drinks a day for men. Know how much alcohol is in a drink. In the [place] one drink equals one 12 oz bottle of beer (355 mL), one 5 oz glass of wine (148 mL), or one 1 oz glass of hard liquor (44 mL). Do not use any products that contain nicotine or tobacco. These products include cigarettes, chewing tobacco, and vaping devices, such as e-cigarettes. If you need help quitting, ask your health care provider. Eat a healthy diet and maintain a healthy weight. Poor diet and excess weight may make pain worse. Eat foods that are high in fiber. These include fresh fruits and vegetables, whole grains, and beans. Limit foods that are high in fat and processed sugars, such as fried and sweet foods. Exercise regularly. Exercise lowers stress and may help relieve pain. Ask your health care provider what activities and exercises are safe for you. If your health care provider approves, join an exercise class that combines movement and stress reduction. Examples include yoga and tai chi. Get enough sleep. Lack of sleep may make pain worse. Lower stress as much as possible. Practice stress reduction techniques as told by your therapist. General instructions Work with all your pain management providers to find the treatments that work best for you. You are an important member of your pain management team. There are many things you can do to reduce pain on your own. Consider joining an online or in-person support group for people who have  chronic pain. Keep all follow-up visits. This is important. Where to find more information You can find more information about managing pain without opioids from: American Academy of Pain Medicine: painmed.org Institute for Chronic Pain: instituteforchronicpain.org American Chronic Pain Association: theacpa.org Contact a health care provider if: You have side effects from pain medicine. Your pain gets worse or does not get better with treatments or home therapy. You are struggling with anxiety or depression. Summary Many types of pain can be managed without opioids. Chronic pain may  respond better to pain management without opioids. Pain is best managed when you and a team of health care providers work together. Pain management without opioids may include non-opioid medicines, medical treatments, physical therapy, mental health therapy, and lifestyle changes. Tell your health care providers if your pain gets worse or is not being managed well enough. This information is not intended to replace advice given to you by your health care provider. Make sure you discuss any questions you have with your health care provider. Document Revised: 06/26/2020 Document Reviewed: 06/26/2020 Elsevier Patient Education  2024 [employer].

## 2024-01-05 NOTE — Progress Notes (Signed)
 Subjective:   Stacey [name] is a 78 y.o. who presents for a Medicare Wellness preventive visit.  As a reminder, Annual Wellness Visits don't include a physical exam, and some assessments may be limited, especially if this visit is performed virtually. We may recommend an in-person follow-up visit with your provider if needed.  Visit Complete: Virtual I connected with  Rock LITTIE Many on 01/05/24 by a audio enabled telemedicine application and verified that I am speaking with the correct person using two identifiers.  Patient Location: Home  Provider Location: Home Office  I discussed the limitations of evaluation and management by telemedicine. The patient expressed understanding and agreed to proceed.  Vital Signs: Because this visit was a virtual/telehealth visit, some criteria may be missing or patient reported. Any vitals not documented were not able to be obtained and vitals that have been documented are patient reported.  VideoDeclined- This patient declined Librarian, academic. Therefore the visit was completed with audio only.  Persons Participating in Visit: Patient assisted by Arland Links, daughter.  AWV Questionnaire: No: Patient Medicare AWV questionnaire was not completed prior to this visit.  Cardiac Risk Factors include: advanced age (>74men, >53 women);dyslipidemia;hypertension     Objective:    Today's Vitals   01/05/24 1547  Weight: 125 lb (56.7 kg)  Height: 5' 2 (1.575 m)   Body mass index is 22.86 kg/m.     01/05/2024    4:02 PM 12/29/2022    4:05 PM 07/18/2022    4:48 PM 08/07/2021    1:26 PM 11/10/2016    3:19 PM 02/04/2015    7:30 PM  Advanced Directives  Does Patient Have a Medical Advance Directive? No Yes No No Yes  Yes   Type of Furniture conservator/restorer;Living will   Healthcare Power of [place];Living will Living will   Does patient want to make changes to medical advance directive?      No  - Patient declined   Copy of Healthcare Power of Attorney in Chart?     No - copy requested  No - copy requested   Would patient like information on creating a medical advance directive? Yes (MAU/Ambulatory/Procedural Areas - Information given)  No - Patient declined No - Patient declined       Data saved with a previous flowsheet row definition    Current Medications (verified) Outpatient Encounter Medications as of 01/05/2024  Medication Sig   acetaminophen  (TYLENOL ) 325 MG tablet Take 2 tablets (650 mg total) by mouth every 6 (six) hours as needed for mild pain (or Fever >/= 101).   amoxicillin (AMOXIL) 500 MG capsule Take 500 mg by mouth 3 (three) times daily.   chlorhexidine (PERIDEX) 0.12 % solution    lamoTRIgine (LAMICTAL) 200 MG tablet Take 200 mg by mouth at bedtime.   memantine (NAMENDA) 5 MG tablet Take 5 mg by mouth 2 (two) times daily.   metoprolol  succinate (TOPROL -XL) 25 MG 24 hr tablet Take 1 tablet (25 mg total) by mouth daily.   mirtazapine (REMERON) 15 MG tablet Take 15 mg by mouth at bedtime.   Multiple Vitamin (MULTI-VITAMIN DAILY PO) Take 1 tablet by mouth daily.   mupirocin  ointment (BACTROBAN ) 2 % Apply 1 Application topically 2 (two) times daily.   pravastatin  (PRAVACHOL ) 20 MG tablet Take 1 tablet (20 mg total) by mouth daily.   traMADol  (ULTRAM ) 50 MG tablet Take 1 tablet (50 mg total) by mouth every 6 (six) hours as  needed for up to 10 doses.   Calcium  Citrate-Vitamin D  (CALCIUM  + D PO) Take 1 tablet by mouth daily. (Patient not taking: Reported on 01/05/2024)   donepezil  (ARICEPT ) 5 MG tablet TAKE ONE TABLET BY MOUTH EVERY NIGHT AT BEDTIME (Patient not taking: Reported on 01/05/2024)   lamoTRIgine (LAMICTAL) 150 MG tablet Take 150 mg by mouth daily. (Patient not taking: Reported on 01/05/2024)   lisinopril  (ZESTRIL ) 5 MG tablet TAKE 1 TABLET BY MOUTH DAILY (Patient not taking: Reported on 01/05/2024)   mometasone  (ELOCON ) 0.1 % cream APPLY TOPICALLY TO ITCHY AREAS ON  BACK 2 TIMES A DAY AS NEEDED UNITL CLEAR (Patient not taking: Reported on 01/05/2024)   No facility-administered encounter medications on file as of 01/05/2024.    Allergies (verified) Patient has no known allergies.   History: Past Medical History:  Diagnosis Date   Actinic keratosis 02/28/2010   Dorsum nose.   Basal cell carcinoma 05/04/2016   Left sup nasolabial area near nose. Nodular pattern. Re-shave and EDC 06/16/2016   Basal cell carcinoma 02/23/2023   Left upper medial eyebrow, ED&C 05/10/23   Basal cell carcinoma 11/09/2023   left glabella, needs Mohs scheduled w/Dr Corey   Hyperlipidemia    Hypertension    Olecranon bursitis    left    Osteoporosis    Squamous cell carcinoma of skin 05/20/2022   Right Zygomatic Area, [facility]   Past Surgical History:  Procedure Laterality Date   ABDOMINAL HYSTERECTOMY     INTRAMEDULLARY (IM) NAIL INTERTROCHANTERIC Right 02/05/2015   Procedure: INTRAMEDULLARY (IM) NAIL INTERTROCHANTRIC;  Surgeon: Norleen JINNY Maltos, MD;  Location: [facility] ORS;  Service: Orthopedics;  Laterality: Right;   Family History  Problem Relation Age of Onset   Cancer Mother        colon   Cancer Father        lung cancer   Social History   Socioeconomic History   Marital status: Widowed    Spouse name: Not on file   Number of children: 3   Years of education: Not on file   Highest education level: Not on file  Occupational History   Occupation: retired  Tobacco Use   Smoking status: Never   Smokeless tobacco: Never  Vaping Use   Vaping status: Never Used  Substance and Sexual Activity   Alcohol use: No   Drug use: No   Sexual activity: Not Currently  Other Topics Concern   Not on file  Social History Narrative   Not on file   Social Drivers of Health   Financial Resource Strain: Low Risk  (01/05/2024)   Overall Financial Resource Strain (CARDIA)    Difficulty of Paying Living Expenses: Not hard at all  Recent Concern: Financial Resource Strain -  Medium Risk (11/04/2023)   Received from [facility]   Overall Financial Resource Strain (CARDIA)    Difficulty of Paying Living Expenses: Somewhat hard  Food Insecurity: No Food Insecurity (01/05/2024)   Hunger Vital Sign    Worried About Running Out of Food in the Last Year: Never true    Ran Out of Food in the Last Year: Never true  Transportation Needs: No Transportation Needs (01/05/2024)   PRAPARE - Administrator, Civil Service (Medical): No    Lack of Transportation (Non-Medical): No  Physical Activity: Inactive (01/05/2024)   Exercise Vital Sign    Days of Exercise per Week: 0 days    Minutes of Exercise per Session: 0 min  Stress: No Stress Concern Present (01/05/2024)   [employer] of Occupational Health - Occupational Stress Questionnaire    Feeling of Stress: Not at all  Social Connections: Moderately Isolated (01/05/2024)   Social Connection and Isolation Panel    Frequency of Communication with Friends and Family: Twice a week    Frequency of Social Gatherings with Friends and Family: More than three times a week    Attends Religious Services: More than 4 times per year    Active Member of [employer] or Organizations: No    Attends Banker Meetings: Never    Marital Status: Widowed    Tobacco Counseling Counseling given: Not Answered    Clinical Intake:  Pre-visit preparation completed: Yes  Pain : No/denies pain     BMI - recorded: 22.86 Nutritional Status: BMI of 19-24  Normal Nutritional Risks: None Diabetes: No  Lab Results  Component Value Date   HGBA1C 5.6 02/18/2023   HGBA1C 5.5 08/13/2022     How often do you need to have someone help you when you read instructions, pamphlets, or other written materials from your doctor or pharmacy?: 1 - Never  Interpreter Needed?: No  Information entered by :: Vina Ned, CMA   Activities of Daily Living     01/05/2024    3:50 PM  In your present state of  health, do you have any difficulty performing the following activities:  Hearing? 0  Vision? 0  Difficulty concentrating or making decisions? 1  Comment mild alzheimer's and vascular dementia  Walking or climbing stairs? 1  Comment uses rollator prn  Dressing or bathing? 0  Doing errands, shopping? 1  Comment doesn't drive, daughters takes to appointments  Preparing Food and eating ? N  Using the Toilet? N  In the past six months, have you accidently leaked urine? Y  Comment wears pads  Do you have problems with loss of bowel control? N  Managing your Medications? CINDERELLA Lenise Race, daughter manages medications  Managing your Finances? Y  Comment daughters manage finances  Housekeeping or managing your Housekeeping? N    Patient Care Team: Ostwalt, Janna, PA-C as PCP - General (Physician Assistant) Mevelyn JONETTA Bathe, OD as Consulting Physician (Optometry) Paci, Karina M, MD (Dermatology) Jackquline Sawyer, MD (Dermatology) Douglas Rule, MD as Consulting Physician (Nephrology) Evern Allyson Hacker, FNP (Neurology) Kathlynn Sharper, MD as Consulting Physician (Orthopedic Surgery) Ulis Bottcher, PA-C as Physician Assistant (Neurosurgery)  I have updated your Care Teams any recent Medical Services you may have received from other providers in the past year.     Assessment:   This is a routine wellness examination for [place].  Hearing/Vision screen Hearing Screening - Comments:: Denies hearing loss  Vision Screening - Comments:: Gets routine eye exams, [facility]   Goals Addressed             This Visit's Progress    Patient Stated       Maintain current health       Depression Screen     01/05/2024    4:00 PM 02/12/2023    4:46 PM 12/29/2022    3:58 PM 08/13/2022    1:50 PM 08/07/2021    1:24 PM 07/15/2021    2:24 PM 09/12/2020    2:21 PM  PHQ 2/9 Scores  PHQ - 2 Score 0 2 0 0 0 0 0  PHQ- 9 Score 2 3  1  0 0 0    Fall Risk  01/05/2024     4:04 PM 02/12/2023    4:46 PM 12/29/2022    3:48 PM 08/13/2022    1:50 PM 03/16/2022    8:27 AM  Fall Risk   Falls in the past year? 0 1 0 1 1  Number falls in past yr: 0 0 0 1 1  Injury with Fall? 0 1 0 1 1  Comment    hand/finger   Risk for fall due to : History of fall(s);Impaired balance/gait;Orthopedic patient Impaired balance/gait;History of fall(s) Impaired balance/gait  History of fall(s);Impaired balance/gait;Mental status change;Impaired mobility;Medication side effect  Follow up Falls evaluation completed;Education provided Falls evaluation completed Education provided;Falls prevention discussed  Falls evaluation completed;Education provided;Falls prevention discussed      Data saved with a previous flowsheet row definition    MEDICARE RISK AT HOME:  Medicare Risk at Home Any stairs in or around the home?: No If so, are there any without handrails?: No Home free of loose throw rugs in walkways, pet beds, electrical cords, etc?: Yes Adequate lighting in your home to reduce risk of falls?: Yes Life alert?: No Use of a cane, walker or w/c?: Yes (rollator prn) Grab bars in the bathroom?: No Shower chair or bench in shower?: Yes Elevated toilet seat or a handicapped toilet?: Yes  TIMED UP AND GO:  Was the test performed?  No  Cognitive Function: 6CIT completed    08/21/2019    9:11 AM  MMSE - Mini Mental State Exam  Orientation to time 4  Orientation to Place 5  Registration 3  Attention/ Calculation 5  Recall 0  Language- name 2 objects 2  Language- repeat 1  Language- follow 3 step command 2  Language- read & follow direction 1  Write a sentence 1  Copy design 1  Total score 25        01/05/2024    4:06 PM  6CIT Screen  What Year? 4 points  What month? 3 points  What time? 0 points  Count back from 20 4 points  Months in reverse 4 points  Repeat phrase 10 points  Total Score 25 points    Immunizations Immunization History  Administered Date(s)  Administered   Fluad Quad(high Dose 65+) 03/13/2022   PFIZER(Purple Top)SARS-COV-2 Vaccination 05/31/2019, 06/18/2019   Pfizer Covid-19 Vaccine Bivalent Booster 12yrs & up 09/12/2020   Td 03/11/2009   Tdap 06/05/2020    Screening Tests Health Maintenance  Topic Date Due   Zoster Vaccines- Shingrix (1 of 2) Never done   Pneumococcal Vaccine: 50+ Years (1 of 1 - PCV) Never done   Influenza Vaccine  10/29/2023   COVID-19 Vaccine (4 - 2025-26 season) 11/29/2023   Medicare Annual Wellness (AWV)  01/04/2025   DTaP/Tdap/Td (3 - Td or Tdap) 06/06/2030   DEXA SCAN  Completed   Hepatitis C Screening  Completed   Meningococcal B Vaccine  Aged Out   Fecal DNA (Cologuard)  Discontinued    Health Maintenance Items Addressed: Vaccines Due: flu, pneumonia and Shinrix, DEXA ordered, See Nurse Notes at the end of this note  Additional Screening:  Vision Screening: Recommended annual ophthalmology exams for early detection of glaucoma and other disorders of the eye. Is the patient up to date with their annual eye exam?  Yes  Who is the provider or what is the name of the office in which the patient attends annual eye exams? Dr. Robynn Antigua, [place]  Dental Screening: Recommended annual dental exams for proper oral hygiene  Community Resource Referral / Chronic Care Management: CRR required this visit?  No   CCM required this visit?  No   Plan:    I have personally reviewed and noted the following in the patient's chart:   Medical and social history Use of alcohol, tobacco or illicit drugs  Current medications and supplements including opioid prescriptions. Patient is currently taking opioid prescriptions. Information provided to patient regarding non-opioid alternatives. Patient advised to discuss non-opioid treatment plan with their provider. Functional ability and status Nutritional status Physical activity Advanced directives List of other physicians Hospitalizations,  surgeries, and ER visits in previous 12 months Vitals Screenings to include cognitive, depression, and falls Referrals and appointments  In addition, I have reviewed and discussed with patient certain preventive protocols, quality metrics, and best practice recommendations. A written personalized care plan for preventive services as well as general preventive health recommendations were provided to patient.   Vina Ned, CMA   01/05/2024   After Visit Summary: (Mail) Due to this being a telephonic visit, the after visit summary with patients personalized plan was offered to patient via mail   Notes:  Arland Links, daughter assisted with today's visit 6 CIT Score - 25 Placed referral for DEXA scan Needs flu and pneumonia vaccine at next OV on 01/14/24 Needs Shingrix vaccine (pharmacy) Declined Covid vaccine Screening colonoscopy and mammogram no longer recommended due to age

## 2024-01-06 ENCOUNTER — Ambulatory Visit: Admitting: Dermatology

## 2024-01-06 ENCOUNTER — Encounter: Payer: Self-pay | Admitting: Dermatology

## 2024-01-06 DIAGNOSIS — C4491 Basal cell carcinoma of skin, unspecified: Secondary | ICD-10-CM

## 2024-01-06 DIAGNOSIS — T1490XD Injury, unspecified, subsequent encounter: Secondary | ICD-10-CM

## 2024-01-06 NOTE — Patient Instructions (Signed)

## 2024-01-06 NOTE — Progress Notes (Unsigned)
   Follow Up Visit   Subjective  Stacey [name] is a 78 y.o. female who presents for the following: follow up from Mohs surgery   The patient presents for follow up from Mohs surgery for a BCC on the left glabella, treated on 12/16/23, repaired with advancement flap. The patient has been bandaging the wound as directed. The endorse the following concerns: none. She is accompanied by her daughter.  The following portions of the chart were reviewed this encounter and updated as appropriate: medications, allergies, medical history  Review of Systems:  No other skin or systemic complaints except as noted in HPI or Assessment and Plan.  Objective  Well appearing patient in no apparent distress; mood and affect are within normal limits.  A focal examination was performed including scalp, head, face. All findings within normal limits unless otherwise noted below.  Healing wound with mild erythema  Relevant physical exam findings are noted in the Assessment and Plan.    Assessment & Plan   Healing Wound s/p Mohs for [facility] on the left glabella, treated on 12/16/23, repaired with advancement flap - Reassured that wound is healing well - No evidence of infection - No swelling, induration, purulence, dehiscence, or tenderness out of proportion to the clinical exam, see photo above - Discussed that scars take up to 12 months to mature from the date of surgery - Recommend SPF 30+ to scar daily to prevent purple color from UV exposure during scar maturation process - Discussed that erythema and raised appearance of scar will fade over the next 4-6 months - OK to start scar massage at 4-6 weeks post-op - Can consider silicone based products for scar healing starting at 6 weeks post-op - Ok to continue ointment daily to wound under a bandage for another week  HISTORY OF BASAL CELL CARCINOMA OF THE SKIN - No evidence of recurrence today - Recommend regular full body skin exams - Recommend daily  broad spectrum sunscreen SPF 30+ to sun-exposed areas, reapply every 2 hours as needed.  - Call if any new or changing lesions are noted between office visits    Return in about 4 weeks (around 02/03/2024) for wound check.  I, Darice Smock, CMA, am acting as scribe for RUFUS CHRISTELLA HOLY, MD.   Documentation: I have reviewed the above documentation for accuracy and completeness, and I agree with the above.  RUFUS CHRISTELLA HOLY, MD

## 2024-01-07 ENCOUNTER — Other Ambulatory Visit: Payer: Self-pay | Admitting: Physician Assistant

## 2024-01-07 DIAGNOSIS — E782 Mixed hyperlipidemia: Secondary | ICD-10-CM

## 2024-01-07 DIAGNOSIS — I10 Essential (primary) hypertension: Secondary | ICD-10-CM

## 2024-01-14 ENCOUNTER — Ambulatory Visit (INDEPENDENT_AMBULATORY_CARE_PROVIDER_SITE_OTHER): Admitting: Physician Assistant

## 2024-01-14 DIAGNOSIS — Z23 Encounter for immunization: Secondary | ICD-10-CM

## 2024-01-14 NOTE — Progress Notes (Unsigned)
 Patient is in office today for a nurse visit for Immunization. Patient Injection was given in the  Right deltoid. Patient tolerated injection well. Influenza. Patient is in office today for a nurse visit for Immunization. Patient Injection was given in the  Left deltoid. Patient tolerated injection well. Pneumococcal 20.

## 2024-01-16 NOTE — Addendum Note (Signed)
 Addended by: Skyann Ganim on: 01/16/2024 09:27 PM   Modules accepted: Level of Service

## 2024-02-03 ENCOUNTER — Encounter: Payer: Self-pay | Admitting: Dermatology

## 2024-02-03 ENCOUNTER — Ambulatory Visit: Admitting: Dermatology

## 2024-02-03 VITALS — BP 139/69 | HR 73 | Temp 98.0°F

## 2024-02-03 DIAGNOSIS — T1490XD Injury, unspecified, subsequent encounter: Secondary | ICD-10-CM

## 2024-02-03 DIAGNOSIS — L905 Scar conditions and fibrosis of skin: Secondary | ICD-10-CM

## 2024-02-03 DIAGNOSIS — Z85828 Personal history of other malignant neoplasm of skin: Secondary | ICD-10-CM

## 2024-02-03 DIAGNOSIS — C4491 Basal cell carcinoma of skin, unspecified: Secondary | ICD-10-CM

## 2024-02-03 NOTE — Patient Instructions (Signed)

## 2024-02-03 NOTE — Progress Notes (Unsigned)
   Follow Up Visit   Subjective  Stacey [name] is a 77 y.o. female who presents for the following: follow up from Mohs surgery   The patient presents for follow up from Mohs surgery for a BCC on the left glabella, treated on 12/16/23, repaired with advancement flap. The patient has been bandaging the wound as directed. The endorse the following concerns: none. Accompanied by her daughter.   The following portions of the chart were reviewed this encounter and updated as appropriate: medications, allergies, medical history  Review of Systems:  No other skin or systemic complaints except as noted in HPI or Assessment and Plan.  Objective  Well appearing patient in no apparent distress; mood and affect are within normal limits.  A focal examination was performed including scalp, head, face all findings within normal limits unless otherwise noted below.  Healing wound with mild erythema  Relevant physical exam findings are noted in the Assessment and Plan.        Assessment & Plan   Healing Wound s/p Mohs for [facility] on the left forehead, treated on left glabella treated on 12/16/23, repaired with advancement flap - Reassured that wound is healing well - No evidence of infection - No swelling, induration, purulence, dehiscence, or tenderness out of proportion to the clinical exam, see photo above - Discussed that scars take up to 12 months to mature from the date of surgery - Recommend SPF 30+ to scar daily to prevent purple color from UV exposure during scar maturation process - Discussed that erythema and raised appearance of scar will fade over the next 4-6 months - OK to start scar massage at 4-6 weeks post-op - Can consider silicone based products for scar healing starting at 6 weeks post-op - Ok to continue ointment daily to wound under a bandage for another 6 weeks  HISTORY OF BASAL CELL CARCINOMA OF THE SKIN - No evidence of recurrence today - Recommend regular full body skin  exams - Recommend daily broad spectrum sunscreen SPF 30+ to sun-exposed areas, reapply every 2 hours as needed.  - Call if any new or changing lesions are noted between office visits    Return in about 6 weeks (around 03/16/2024) for wound check follow up.  I, Doyce Pan, CMA, am acting as scribe for RUFUS CHRISTELLA HOLY, MD.   Documentation: I have reviewed the above documentation for accuracy and completeness, and I agree with the above.  RUFUS CHRISTELLA HOLY, MD

## 2024-02-04 ENCOUNTER — Other Ambulatory Visit: Payer: Self-pay | Admitting: Physician Assistant

## 2024-02-04 DIAGNOSIS — I10 Essential (primary) hypertension: Secondary | ICD-10-CM

## 2024-02-04 DIAGNOSIS — E782 Mixed hyperlipidemia: Secondary | ICD-10-CM

## 2024-02-07 ENCOUNTER — Telehealth: Payer: Self-pay | Admitting: Physician Assistant

## 2024-02-07 NOTE — Telephone Encounter (Signed)
[facility] faxed refill request for the following medications:  metoprolol succinate (TOPROL-XL) 25 MG 24 hr tablet   Please advise.  

## 2024-02-07 NOTE — Telephone Encounter (Signed)
 Medication has been sent in by another clinical staff.

## 2024-02-18 ENCOUNTER — Encounter: Payer: Self-pay | Admitting: Physician Assistant

## 2024-03-08 ENCOUNTER — Other Ambulatory Visit: Payer: Self-pay | Admitting: Physician Assistant

## 2024-03-08 DIAGNOSIS — E782 Mixed hyperlipidemia: Secondary | ICD-10-CM

## 2024-03-08 DIAGNOSIS — I10 Essential (primary) hypertension: Secondary | ICD-10-CM

## 2024-03-16 ENCOUNTER — Ambulatory Visit: Admitting: Dermatology

## 2024-03-16 ENCOUNTER — Encounter: Payer: Self-pay | Admitting: Dermatology

## 2024-03-16 VITALS — BP 118/86 | HR 75 | Temp 98.3°F

## 2024-03-16 DIAGNOSIS — Z85828 Personal history of other malignant neoplasm of skin: Secondary | ICD-10-CM

## 2024-03-16 DIAGNOSIS — L905 Scar conditions and fibrosis of skin: Secondary | ICD-10-CM

## 2024-03-16 DIAGNOSIS — C4491 Basal cell carcinoma of skin, unspecified: Secondary | ICD-10-CM

## 2024-03-16 MED ORDER — HYDROCORTISONE 2.5 % EX OINT: TOPICAL_OINTMENT | Freq: Two times a day (BID) | CUTANEOUS | 0 refills | Status: AC

## 2024-03-16 NOTE — Progress Notes (Signed)
° °  Follow Up Visit   Subjective  Stacey [name] is a 78 y.o. female who presents for the following: follow up from Mohs surgery   The patient presents for follow up from Mohs surgery for a BCC on the left glabella, treated on 12/16/23, repaired with advancement flap. The patient has been bandaging the wound as directed. The endorse the following concerns: none. Accompanied by her daughter.   The following portions of the chart were reviewed this encounter and updated as appropriate: medications, allergies, medical history  Review of Systems:  No other skin or systemic complaints except as noted in HPI or Assessment and Plan.  Objective     Well appearing patient in no apparent distress; mood and affect are within normal limits.  A focal examination was performed including scalp, head, face all findings within normal limits unless otherwise noted below.  Healing wound with mild erythema  Relevant physical exam findings are noted in the Assessment and Plan.     Assessment & Plan   Healing Wound s/p Mohs for [facility] on the left glabella treated on 12/16/23, repaired with advancement flap - Reassured that wound is healing well - No evidence of infection - No swelling, induration, purulence, dehiscence, or tenderness out of proportion to the clinical exam, see photo above - Excoriation present, likely from nerve growth causing itching - Apply Hydrocortisone  over wound for itching   HISTORY OF BASAL CELL CARCINOMA OF THE SKIN - No evidence of recurrence today - Recommend regular full body skin exams - Recommend daily broad spectrum sunscreen SPF 30+ to sun-exposed areas, reapply every 2 hours as needed.  - Call if any new or changing lesions are noted between office visits    Return in about 6 weeks (around 04/27/2024) for Follow Up.  I, Rollene Gobble, RN, am acting as scribe for RUFUS CHRISTELLA HOLY, MD .   Documentation: I have reviewed the above documentation for accuracy and  completeness, and I agree with the above.  RUFUS CHRISTELLA HOLY, MD

## 2024-03-16 NOTE — Patient Instructions (Signed)

## 2024-04-06 ENCOUNTER — Other Ambulatory Visit: Payer: Self-pay | Admitting: Physician Assistant

## 2024-04-06 DIAGNOSIS — E782 Mixed hyperlipidemia: Secondary | ICD-10-CM

## 2024-04-06 DIAGNOSIS — I10 Essential (primary) hypertension: Secondary | ICD-10-CM

## 2024-04-14 ENCOUNTER — Encounter: Admitting: Physician Assistant

## 2024-04-14 DIAGNOSIS — M810 Age-related osteoporosis without current pathological fracture: Secondary | ICD-10-CM

## 2024-04-14 DIAGNOSIS — F0280 Dementia in other diseases classified elsewhere without behavioral disturbance: Secondary | ICD-10-CM

## 2024-04-14 DIAGNOSIS — E782 Mixed hyperlipidemia: Secondary | ICD-10-CM

## 2024-04-14 DIAGNOSIS — I10 Essential (primary) hypertension: Secondary | ICD-10-CM

## 2024-04-21 ENCOUNTER — Encounter: Admitting: Physician Assistant

## 2024-05-03 ENCOUNTER — Other Ambulatory Visit: Payer: Self-pay | Admitting: Physician Assistant

## 2024-05-03 DIAGNOSIS — I10 Essential (primary) hypertension: Secondary | ICD-10-CM

## 2024-05-03 DIAGNOSIS — E782 Mixed hyperlipidemia: Secondary | ICD-10-CM

## 2024-05-04 ENCOUNTER — Ambulatory Visit: Admitting: Dermatology

## 2024-05-09 ENCOUNTER — Ambulatory Visit: Admitting: Dermatology

## 2024-05-25 ENCOUNTER — Ambulatory Visit: Admitting: Dermatology

## 2024-06-01 ENCOUNTER — Encounter: Admitting: Physician Assistant

## 2025-01-10 ENCOUNTER — Ambulatory Visit
